# Patient Record
Sex: Female | Born: 1935 | Race: White | Hispanic: No | Smoking: Former smoker
Health system: Southern US, Community
[De-identification: ages and names within clinical notes are randomized; demographics above are authoritative.]

## PROBLEM LIST (undated history)

## (undated) DIAGNOSIS — D510 Vitamin B12 deficiency anemia due to intrinsic factor deficiency: Secondary | ICD-10-CM

## (undated) DIAGNOSIS — I10 Essential (primary) hypertension: Secondary | ICD-10-CM

## (undated) DIAGNOSIS — E039 Hypothyroidism, unspecified: Secondary | ICD-10-CM

## (undated) DIAGNOSIS — R519 Headache, unspecified: Secondary | ICD-10-CM

## (undated) DIAGNOSIS — Z66 Do not resuscitate: Secondary | ICD-10-CM

## (undated) DIAGNOSIS — M8580 Other specified disorders of bone density and structure, unspecified site: Secondary | ICD-10-CM

## (undated) DIAGNOSIS — E785 Hyperlipidemia, unspecified: Secondary | ICD-10-CM

## (undated) DIAGNOSIS — Z87891 Personal history of nicotine dependence: Secondary | ICD-10-CM

## (undated) DIAGNOSIS — D649 Anemia, unspecified: Secondary | ICD-10-CM

## (undated) DIAGNOSIS — Q2730 Arteriovenous malformation, site unspecified: Secondary | ICD-10-CM

## (undated) DIAGNOSIS — Z87898 Personal history of other specified conditions: Secondary | ICD-10-CM

## (undated) DIAGNOSIS — E669 Obesity, unspecified: Secondary | ICD-10-CM

## (undated) DIAGNOSIS — M1990 Unspecified osteoarthritis, unspecified site: Secondary | ICD-10-CM

## (undated) DIAGNOSIS — R269 Unspecified abnormalities of gait and mobility: Secondary | ICD-10-CM

## (undated) DIAGNOSIS — R2689 Other abnormalities of gait and mobility: Secondary | ICD-10-CM

## (undated) DIAGNOSIS — I639 Cerebral infarction, unspecified: Secondary | ICD-10-CM

## (undated) HISTORY — PX: HYSTERECTOMY ABDOMINAL WITH SALPINGO-OOPHORECTOMY: SHX6792

## (undated) HISTORY — DX: Vitamin B12 deficiency anemia due to intrinsic factor deficiency: D51.0

## (undated) HISTORY — DX: Hypothyroidism, unspecified: E03.9

## (undated) HISTORY — DX: Do not resuscitate: Z66

## (undated) HISTORY — PX: APPENDECTOMY: SHX54

## (undated) HISTORY — DX: Arteriovenous malformation, site unspecified: Q27.30

## (undated) HISTORY — DX: Personal history of nicotine dependence: Z87.891

## (undated) HISTORY — DX: Hyperlipidemia, unspecified: E78.5

## (undated) HISTORY — PX: CERVICAL SPINE SURGERY: SHX589

## (undated) HISTORY — DX: Obesity, unspecified: E66.9

## (undated) HISTORY — DX: Other abnormalities of gait and mobility: R26.89

## (undated) HISTORY — DX: Anemia, unspecified: D64.9

## (undated) HISTORY — DX: Cerebral infarction, unspecified: I63.9

## (undated) HISTORY — PX: REPLACEMENT TOTAL KNEE: SUR1224

## (undated) HISTORY — PX: CATARACT EXTRACTION: SUR2

## (undated) HISTORY — DX: Other specified disorders of bone density and structure, unspecified site: M85.80

## (undated) HISTORY — PX: TONSILLECTOMY: SUR1361

## (undated) HISTORY — DX: Personal history of other specified conditions: Z87.898

## (undated) HISTORY — DX: Unspecified abnormalities of gait and mobility: R26.9

## (undated) HISTORY — PX: CHOLECYSTECTOMY: SHX55

## (undated) HISTORY — DX: Essential (primary) hypertension: I10

## (undated) HISTORY — PX: TOTAL HIP ARTHROPLASTY: SHX124

---

## 2000-09-20 ENCOUNTER — Encounter: Admission: RE | Admit: 2000-09-20 | Discharge: 2000-09-20 | Payer: Self-pay | Admitting: Neurological Surgery

## 2000-09-20 ENCOUNTER — Encounter: Payer: Self-pay | Admitting: Neurological Surgery

## 2001-01-09 ENCOUNTER — Encounter: Payer: Self-pay | Admitting: Neurological Surgery

## 2001-01-09 ENCOUNTER — Observation Stay (HOSPITAL_COMMUNITY): Admission: RE | Admit: 2001-01-09 | Discharge: 2001-01-10 | Payer: Self-pay | Admitting: Neurological Surgery

## 2001-02-02 ENCOUNTER — Encounter: Admission: RE | Admit: 2001-02-02 | Discharge: 2001-02-02 | Payer: Self-pay | Admitting: Neurological Surgery

## 2001-02-02 ENCOUNTER — Encounter: Payer: Self-pay | Admitting: Neurological Surgery

## 2001-04-04 ENCOUNTER — Encounter: Payer: Self-pay | Admitting: Neurological Surgery

## 2001-04-04 ENCOUNTER — Encounter: Admission: RE | Admit: 2001-04-04 | Discharge: 2001-04-04 | Payer: Self-pay | Admitting: Neurological Surgery

## 2003-01-02 ENCOUNTER — Encounter: Admission: RE | Admit: 2003-01-02 | Discharge: 2003-01-02 | Payer: Self-pay | Admitting: Neurological Surgery

## 2003-01-02 ENCOUNTER — Encounter: Payer: Self-pay | Admitting: Neurological Surgery

## 2003-04-24 ENCOUNTER — Encounter: Admission: RE | Admit: 2003-04-24 | Discharge: 2003-04-24 | Payer: Self-pay | Admitting: Orthopedic Surgery

## 2003-04-24 ENCOUNTER — Encounter: Payer: Self-pay | Admitting: Orthopedic Surgery

## 2003-10-31 ENCOUNTER — Encounter: Admission: RE | Admit: 2003-10-31 | Discharge: 2003-10-31 | Payer: Self-pay | Admitting: Orthopedic Surgery

## 2003-12-31 ENCOUNTER — Inpatient Hospital Stay (HOSPITAL_COMMUNITY): Admission: RE | Admit: 2003-12-31 | Discharge: 2004-01-05 | Payer: Self-pay | Admitting: Orthopedic Surgery

## 2004-03-22 ENCOUNTER — Encounter (HOSPITAL_COMMUNITY): Admission: RE | Admit: 2004-03-22 | Discharge: 2004-06-20 | Payer: Self-pay | Admitting: Orthopedic Surgery

## 2004-04-16 ENCOUNTER — Encounter (INDEPENDENT_AMBULATORY_CARE_PROVIDER_SITE_OTHER): Payer: Self-pay | Admitting: *Deleted

## 2004-04-16 ENCOUNTER — Inpatient Hospital Stay (HOSPITAL_COMMUNITY): Admission: RE | Admit: 2004-04-16 | Discharge: 2004-04-19 | Payer: Self-pay | Admitting: Orthopedic Surgery

## 2006-08-23 ENCOUNTER — Encounter: Admission: RE | Admit: 2006-08-23 | Discharge: 2006-08-23 | Payer: Self-pay | Admitting: Orthopedic Surgery

## 2007-06-30 ENCOUNTER — Encounter: Admission: RE | Admit: 2007-06-30 | Discharge: 2007-06-30 | Payer: Self-pay | Admitting: Orthopedic Surgery

## 2007-07-27 ENCOUNTER — Encounter: Admission: RE | Admit: 2007-07-27 | Discharge: 2007-07-27 | Payer: Self-pay | Admitting: Neurological Surgery

## 2010-11-14 HISTORY — PX: SHOULDER ARTHROSCOPY: SHX128

## 2010-12-06 ENCOUNTER — Encounter: Payer: Self-pay | Admitting: Neurological Surgery

## 2011-04-01 NOTE — Discharge Summary (Signed)
Alexis Stevenson, Alexis Stevenson                            ACCOUNT NO.:  [id number]   MEDICAL RECORD NO.:  [id number]                   PATIENT TYPE:  INP   LOCATION:  5023                                 FACILITY:  [facility]   PHYSICIAN:  Mila Homer. Sherlean Foot, M.D.              DATE OF BIRTH:  03/06/1934   DATE OF ADMISSION:  04/16/2004  DATE OF DISCHARGE:  04/19/2004                                 DISCHARGE SUMMARY   ADMISSION DIAGNOSES:  1. Chronically painful, swollen, hot, right total knee arthroplasty with     total knee arthroplasty being 3 months old with elevated C-reactive     protein and sedimentation rate suspicious for infection.  2. Hypertension.  3. Hypercholesterolemia.   DISCHARGE DIAGNOSES:  1. Revision of right total knee arthroplasty with exchange, deep irrigation     and synovectomy.  2. Hypertension.   HISTORY OF PRESENT ILLNESS:  The patient is a 77 year old female who had a  left total knee arthroplasty by Dr. Madelon Lips 3 months previous.  The patient  had been having chronic pain, redness and swelling within the knee.  It was  evaluated with lab tests with an elevated CRP, sedimentation rate and is  suspicious for infection.  The patient was admitted for intraoperative  evaluation of the knee with possible revision and debridement with possible  expectations of removal of total knee hardware and implanted antibiotic  impregnated cement spacer with IV antibiotics if found to be infected.   ALLERGIES:  PENICILLIN.  CODEINE.  RELAFEN.  CELEBREX.  VIOXX.   CURRENT MEDICATIONS:  Glipizide, Premarin, Toprol XL, Zocor, Calcium,  Vitamin B12 and B6, Senokot, OxyContin, oxycodone.   PROCEDURE:  On April 16, 2004, the patient was taken to the operating room by  Dr. Mila Homer. Lucey, assisted by Richardean Canal, P.A.-C.  Under general  anesthesia, the patient underwent a right total knee revision, one-sided,  with polyethylene exchange, deep irrigation and debridement with  synovectomy.   Cultures were sent and no obvious signs of infection noted.  The wound was closed with PDS sutures.  One Hemovac drain was left in place.  The patient was discharged to the recovery room and then to the orthopedic  floor in good condition.   CONSULTATIONS:  1. Physical therapy.  2. Occupational therapy.  3. Case management.   HOSPITAL COURSE:  On April 16, 2004, the patient was taken to the OR by Dr.  Georgena Spurling.  A right total knee arthroplasty debridement, polyethylene  exchange and synovectomy was performed.  Cultures were sent to the  pathologist.  No obvious signs of infection were noted.  The patient was  then transferred to the recovery room and then to the orthopedic floor in  good condition for routine total knee protocol.   The patient then incurred a total of 3 days postoperative care on the  orthopedic floor in which the patient had no  complaints and no problems.  The patient worked well with physical therapy.  She was in the CPM on a  daily basis.  Her vital signs remained stable.  She remained afebrile.  Her  wound remained benign for any further signs of infection.  Her leg remained  neurovascularly intact and on postop day #3, the patient was very  comfortable and ready for discharge home.  From a medical and orthopedic  standpoint, she was both stable and ready for discharge for outpatient home  health physical therapy for routine protocol.  The patient was placed on  Lovenox for routine DVT prophylaxis and she was discharged home for routine  protocol.   LABORATORY DATA AND X-RAY FINDINGS:  H&H on June 6, with hemoglobin 10.4,  hematocrit 30.4.  Routine chemistries on June 5, with sodium 141, potassium  3.9, glucose 96, BUN 2, creatinine 0.7.  Synovial cell count  intraoperatively was red and cloudy.  WBCs were not counted.  Neutrophils  were too few to count.  Lymphocytes were rare, no crystals.  Routine  urinalysis on admission was normal.  Gram stains with no  growth x3 days.  Tissue cultures were no growth x2 days.   DISCHARGE MEDICATIONS:  1. Percocet one to two tablets every 4-6 hours p.r.n. pain.  2. Arixtra 2.5 mg subcu q.24h.  3. Capoten 25 mg p.o. q.d.  4. Hydrochlorothiazide 25 mg p.o. q.d.  5. Premarin 0.625 mg p.o. q.d.  6. Toprol XL 50 mg p.o. q.d.  7. Zocor 40 mg p.o. q.d.  8. Laxative or enema of choice p.r.n.  9. Ocuvite one tablet p.o. q.d.  10.      Vitamin B6 100 mg p.o. q.d.  11.      Calcium carbonate 500 mg one tablet p.o. q.d.  12.      Colace 100 mg p.o. b.i.d.  13.      Trinsicon one tablet p.o. t.i.d.  14.      Skelaxin one to two tablets every 4-6 hours p.r.n.  15.      Restoril 15 mg p.o. q.6h. p.r.n.  16.      Potassium chloride 40 mEq p.o. b.i.d.   SPECIAL INSTRUCTIONS:  The patient may resume preoperative medications  except for OxyContin.  Percocet one to two tablets every 4-6h. for pain.  Arixtra 2.5 mg injection once a day until June 10, then may take one baby  aspirin a day for 1 month.  Keflex 500 mg one tablet four times a day until  gone.   DIET:  No restrictions.   ACTIVITY:  Per total knee discharge sheet.   WOUND CARE:  Per total knee discharge sheet.   FOLLOW UP:  The patient is to call Dr. Candise Bowens office for a follow-up  appointment in the next 10-12 days, at [phone], for an appointment.   CONDITION ON DISCHARGE:  Improved.     Jamelle Rushing, P.A.                      Mila Homer. Sherlean Foot, M.D.   RWK/MEDQ  D:  06/07/2004  T:  06/07/2004  Job:  [id number]

## 2011-04-01 NOTE — Op Note (Signed)
[place]  Patient:    Alexis Stevenson, Alexis Stevenson                         MRN: [id number] Proc. Date: 01/09/01 Adm. Date:  [id number] Attending:  Jonne Ply                           Operative Report  PREOPERATIVE DIAGNOSES: 1. Cervical spondylosis, C3-4, with cervical radiculopathy. 2. Status post arthrodesis, C4 to C7, in 1999.  POSTOPERATIVE DIAGNOSIS: 1. Cervical spondylosis, C3-4, with cervical radiculopathy. 2. Status post arthrodesis, C4 to C7, in 1999.  PROCEDURES:  Anterior cervical diskectomy, C3-4, arthrodesis with structural allograft and Synthes fixation, C3, C4, and removal of anterior plate from C4 to C7.  SURGEON:  Stefani Dama, M.D.  FIRST ASSISTANT:  Hewitt Shorts, M.D.  ANESTHESIA:  General endotracheal.  INDICATIONS:  The patient is a 77 year old individual who has had significant neck, shoulder, and arm pain.  She has had a previous anterior diskectomy and arthrodesis from C4 to C7 in 1999.  She had good relief of her symptoms at that time but over the past four to five months time has developed progressive radiculopathy with neck pain, pain in the shoulders, and headaches.  Follow-up MRI demonstrated that some mild spondylitic changes at C3-4 had progressed significantly.  She now has evidence of cord compression and biforaminal stenosis at the C3-4 level.  She was advised regarding surgery.  DESCRIPTION OF PROCEDURE:  The patient was brought to the operating room and placed on the table in supine position.  After the smooth induction of general endotracheal anesthesia, she was placed in five pounds of Holter traction, and the neck was shaved, prepped with Duraprep, and draped in a sterile fashion. An elliptical incision was made around the previous scar, and this was excised.  Dissection was carried down through the platysma, and the plane between the sternocleidomastoid and the strap muscles was dissected  bluntly until the prevertebral space was reached and identification of the previous hardware was made at about the level of C5.  The hardware was then dissected cephalad and inferiorly to expose the screw holes from C4 to C7.  These were then removed, and the plate was removed.  The underlying area was checked for hemostasis, and dissection was then carried out cephalad to expose the C3-4 disk space.  At this point, a self-retaining Caspar retractor was placed into the wound under longus colli muscle.  The disk space at C3-4 was noted to be significantly sclerotic.  A large ventral osteophyte was removed from this area using a rongeur.  Then the disk space was entered and significant quantity of markedly degenerated material that was quite desiccated was removed.  The self-retaining disk spreader was placed into the wound, and then the posterior aspect of the disk space was explored.  There was noted to be significant osteophyte at the inferior margin of the body of C3.  This was drilled down with a Anspach drill and a 2.3 mm matchstick bur.  The dissection was also carried out to the lateral recesses, where a rather large uncinate spur was encountered on the superior portion of the body of C4 on both sides. The right side was taken down first.  The left side was then decompressed, and the foramen was well-decompressed after removal of the spur.  Epidural bleeding was encountered from the lateral aspects  of the dissection.  This was tamponaded with some Gelfoam soaked in thrombin, which was later removed and irrigated away.  In the end, the interspace was noted to be free and clear of any debris.  All the edges were decorticated, and a patellar wedge graft was cut to the appropriate size at an 8 mm height and placed into the interspace. Distraction was removed, and the neck was placed in slight flexion.  A 20 mm small-stature Synthes plate was then affixed from C3 to C4 using 4 x 14  mm locking screws.  Radiographic confirmation of the position of the graft and the plate was obtained, and then hemostasis in the soft tissues was obtained. However, due to the length of the dissection, a small Jackson-Pratt drain was left in place for evacuation of any fluid that may result in an exudate.  The wound was then closed with 3-0 Vicryl in the platysma and 3-0 Vicryl in the subcuticular tissues.  A dry sterile dressing was placed on the neck.  Blood loss was estimated at 150 cc.  The patient tolerated the procedure well and returned to the recovery room in stable condition. DD:  01/09/01 TD:  01/09/01 Job: [id number]

## 2011-04-01 NOTE — H&P (Signed)
NAME:  Alexis Stevenson, Alexis Stevenson                            ACCOUNT NO.:  [id number]   MEDICAL RECORD NO.:  [id number]                   PATIENT TYPE:  INP   LOCATION:  NA                                   FACILITY:  [facility]   PHYSICIAN:  Dyke Brackett, M.D.                 DATE OF BIRTH:  01/13/1934   DATE OF ADMISSION:  12/31/2003  DATE OF DISCHARGE:                                HISTORY & PHYSICAL   CHIEF COMPLAINT:  Right knee pain.   HISTORY OF PRESENT ILLNESS:  Alexis Stevenson is a 77 year old white female with  several years of right knee problems.  She did have some popping, clicking,  locking which required an arthroscopy in 2001.  She had a significant  meniscal injury with debridement.  She did have some short term improvement  of her symptoms but had rapid return of problems and worsening of pain.  The  patient currently has severe pain in her knee with any type of weight-  bearing activity.  She describes this as a sharp, stabbing pain.  It is  improved somewhat when she is off her feet.  The pain does occasionally  radiate down the leg.  She does have swelling at times. She does have  significant popping and grinding for several years now.  She is currently  requiring a cane to assist ambulation.   MRI findings within the knee show a large femoral chondral cartilage defect  of the medial femoral condyle approximately 13 mm in diameter and a large  lateral femoral condyle flap of articular cartilage, possibly being on the  tibial plateau.  She does also have some general chondromalacia changes  throughout.   ALLERGIES:  PENICILLIN and CODEINE.  The patient does have increased  hypertensive issues with ANTI-INFLAMMATORIES, (examples Vioxx, Celebrex,  Relafen).   CURRENT MEDICATIONS:  1. Capozide 25/25 mg p.o. daily.  2. Premarin 0.625 mg p.o. daily.  3. Toprol XL 50 mg p.o. daily.  4. Zocor 40 mg p.o. daily.  5. ICAPS one tablet p.o. daily.  6. Vitamin B6, 100 mcg p.o. daily.  7.  Vitamin B12 1000 mcg p.o. daily.  8. Calcium and vitamin D, 900 mg/300 international units q.d.  9. Senokot 2 tablets q.h.s.  10.      Iron 325 mg two tablets daily.  11.      Tylenol PRN.   PAST MEDICAL HISTORY:  1. Hypertension.  2. Peptic ulcer disease.  3. Hypercholesterolemia.  4. Poor vision.   PAST SURGICAL HISTORY:  Appendectomy in 1955, Cesarean section in 1961,  tonsillectomy in 1964, hysterectomy in 1969, hammer toe repair in 1993,  cervical spinal fusion in 1999, second spinal fusion in 2001, knee scope in  2001.  Patient denies any complications to the above-mentioned surgical  procedures.   SOCIAL HISTORY:  The patient is a healthy-appearing 77 year old white  female.  She does  smoke several cigarettes a day for the last 35 years.  She  denies alcohol.  She is currently married, lives with her husband in a split  level house, six steps to the main entrance.  She is currently retired.  She  has several grown children.   PRIMARY CARE PHYSICIAN:  Her family physician is Dr. Gwendlyn Deutscher, II, [phone].  Her gastroenterologist is Dr. Jeanie Sewer.   FAMILY MEDICAL HISTORY:  Mother is deceased from a stroke.  Father deceased  from heart disease.  The patient has one brother and one sister deceased  from a drowning accident.  One brother deceased from lung cancer.  Three  brothers alive, one with a history of CABG and hypertension, one with colon  cancer and one with hypertension.  One sister alive with unknown medical  issues.   REVIEW OF SYMPTOMS:  Positive for glasses.  She does have occasional  constipation but it is fairly well controlled with the Senokot and she  otherwise has no other review of systems complaints.   PHYSICAL EXAMINATION:  VITAL SIGNS:  Height 5 feet 6 inches, weight 160  pounds.  Blood pressure 138/68.  Pulse 88 and regular.  Respirations 12.  The patient is afebrile.  GENERAL:  This is a healthy-appearing, well-developed white female.  She   does walk with a cane with right leg with significant limp.  She is able to  get on and off the examine table by herself but it does require some effort.  She does constantly have a open patella knee brace on her knee for stability  reasons.  HEENT:  Head was normocephalic.  Pupils equal, round, reactive and  accommodating to light.  Extraocular movements intact.  Sclerae is not  icteric. Conjunctiva is pink and moist.  External ears were without  deformity.  Hearing is intact grossly.  Nasal septum is midline.  Oral  buccal mucosa is pink, moist without lesions.  Dentition is in good repair.  She appeared to have several crowns in place.  NECK:  Supple with no palpable lymphadenopathy.  She is nontender in the  thyroid region.  Due to her spinal fusion she has decreased range of motion  of her cervical spine.  She is able to touch her chin to within two  fingerbreadths of the neck.  She is able to elevate her neck only to about  45 degrees, to rotate to the left 45 degrees and to the right about 60  degrees.  She was slightly sore with percussion along the entire spinal  column.  CHEST:  Lung sounds clear and equal bilaterally.  No wheezes, rales,  rhonchi's or rubs noted.  HEART:  Regular rate and rhythm, S1 and S2 auscultated.  No murmurs, rubs or  gallops noted.  ABDOMEN:  Slightly hyperactive bowel sounds heard.  She is nontender with  palpation throughout.  There is no hepatosplenomegaly.  EXTREMITIES:  Upper extremities are symmetrically sized and shape.  She has  full range of motion of her shoulders, elbows and wrists without any  difficulty.  Motor strength is 5/5.   Lower extremities right and left hip were slightly sore with range of motion  but she had full extension and flexion up to 130 degrees, right was more  sore with internal/external rotation.  Left was asymptomatic.  Right knee was round, boggy appearing.  She had no palpable effusion at this time.  She  is tender  along the medial and lateral joint line.  She had full extension.  She had 110 degrees of flexion.  She has about a 5 to 10 degree valgus varus  laxity.  Calf was nontender.  Left knee was without any signs of erythema or  ecchymoses.  No palpable effusion.  Minimal tenderness along the joint line.  No instability.  She had full extension and flexion to 30 degrees.  Calf was  nontender.  Ankles were symmetrical with good dorsiflexion and plantar  flexion.   Peripheral vasculature carotid pulses were 2+ with no bruits.  Radial pulses  2+.  Dorsalis pedis pulse and posterior tibial pulses were 1+.  She had  brisk capillary refill in the lower extremities.  No lower extremity edema  noted.  NEUROLOGICAL:  Patient is conscious, alert and appropriate and has easy  conversation with examiner.  Cranial nerves II-XII are grossly intact.  She  has light touch sensation intact from head to toe.  She has no gross  neurological defects noted.  BREASTS/RECTAL/GENITOURINARY:  Examinations deferred at this time.   IMPRESSION:  1. End-stage osteoarthritis right knee.  2. Hypertension.  3. History of peptic ulcer disease.  4. Hypercholesterolemia.  5. Decreased vision.   PLAN:  The patient will undergo all routine labs and tests prior to having a  right total knee arthroplasty by Dr. Madelon Lips on December 31, 2003.  The  patient has donated two units of autologous blood at this time.      Jamelle Rushing, Arnetha Courser, M.D.    RWK/MEDQ  D:  12/25/2003  T:  12/25/2003  Job:  [id number]

## 2011-04-01 NOTE — Discharge Summary (Signed)
NAME:  Alexis Stevenson, Alexis Stevenson                            ACCOUNT NO.:  [id number]   MEDICAL RECORD NO.:  [id number]                   PATIENT TYPE:  INP   LOCATION:  5040                                 FACILITY:  [facility]   PHYSICIAN:  Dyke Brackett, M.D.                 DATE OF BIRTH:  28-Oct-1934   DATE OF ADMISSION:  12/31/2003  DATE OF DISCHARGE:  01/05/2004                                 DISCHARGE SUMMARY   ADMITTING DIAGNOSES:  1. End-stage osteoarthritis, right knee.  2. Hypertension.  3. History of peptic ulcer disease.  4. Hypercholesterolemia.  5. Decreased vision.   DISCHARGE DIAGNOSES:  1. Status post right total knee arthroplasty.  2. Acute blood loss anemia secondary to surgery, asymptomatic.  3. Hypokalemia, resolved.  4. Hypertension.  5. History of peptic ulcer disease.  6. Hypercholesterolemia.  7. Decreased vision.   HISTORY OF PRESENT ILLNESS:  Alexis Stevenson is a 77 year old white female with a  history of right knee pain for several years.  Mechanical symptoms include  positive clicking and locking for which she underwent a knee arthroscopy in  2001.  The patient, at that time, had significant meniscal injury and  underwent debridement.  She had some short-term improvement of her symptoms  but her pain rapidly returned and problems were worse as far as pain.  She  currently describes her right knee pain as severe pain with any  weightbearing activity.  The pain is sharp, stabbing in nature.  The pain  occasionally radiates down her leg.  She does have swelling in her legs at  times and in her right knee at times.  The patient requires the assistance  of a cane to ambulate.  MRI findings showed large femoral condyle cartilage  defect of the medial femoral condyle approximately 13 mm in diameter and a  large lateral femoral condyle flap of articular cartilage, possibly being on  the tibial plateau.  Some general chondromalacia changes were seen  throughout the knee.  The  patient, therefore, was admitted to [facility] on 12/31/2003 to undergo a right total knee replacement.   ALLERGIES:  PENICILLIN and CODEINE.  Also, increased hypersensitivity issues  with ANTI INFLAMMATORIES, i.e. VIOXX, CELEBREX, RELAFEN.   CURRENT MEDICATIONS:  1. Catazide 25/25 mg p.o. q.d.  2. Premarin 0.625 mg p.o. q.d.  3. Toprol XL 50 mg p.o. q.d.  4. Zocor 40 mg p.o. q.d.  5. ICAPS 1 tab p.o. q.d.  6. Vitamin B6 100 mcg p.o. q.d.  7. Vitamin B12 1000 mcg p.o. q.d.  8. Calcium with vitamin D 900 mg/300 international units q.d.  9. Senokot 2 tabs q.h.s.  10.      Iron 325 mg, 2 tabs q.d.  11.      Tylenol p.r.n.   SURGICAL PROCEDURE:  The patient was taken to the operating room on December 31, 2003, by  Dr. Madelon Lips assisted by Alexis Stevenson, P.A.-C.  The patient  was placed under general anesthesia with a preoperative femoral nerve block.  Right total knee replacement was performed using a LCS total knee with  standard femur, standard patella 2.5 size tibia with a 50 mm bearing.  The  patient tolerated the procedure well and returned to recovery in good and  stable condition.   CONSULTS:  The following consults were obtained when the patient was  hospitalized:  PT, OT and case management.   HOSPITAL COURSE:  The patient developed acute blood loss anemia secondary to  surgery, however, remained asymptomatic throughout hospital course and  required no blood products.  H&H on January 04, 2004, was 9.7 and 26.3.  The patient also developed hypokalemia during hospital stay, however, this  was treated and resolved.  The patient developed leukocytosis with a maximal  white blood count of 13,300.  However, the patient remained afebrile  throughout hospital stay.  Urinalysis was repeated during hospital stay and  only showed rare bacteria.  At the time of discharge, white blood count was  11,200.  The patient otherwise was discharged home in good and stable  condition  on postoperative day 5.   ROUTINE LABS ON ADMISSION:  CBC:  White blood count 10.7, hemoglobin 13.3,  hematocrit 38.3, platelets 330.  Coags on admission:  PT 12.8, INR 0.9, PTT  42.  Routine chemistries:  Sodium 140, potassium 3.6, chloride 104, bicarb  29, glucose 109, BUN 9, creatinine 0.8.  Hepatic enzymes on admission:  Negative.   Urinalysis on admission was negative.   EKG:  Dated December 25, 2003, showed bradycardia with an unusual P axis.  Heart rate was 59 beats per minute, PR interval 122 ms, PRT axis negative  43, 4 and 41.   Lower extremity Doppler performed on January 03, 2004, showed no evidence  of DVT, SVT or Morissette's cyst bilaterally.   DISCHARGE INSTRUCTIONS:  Meds, the patient to resume home meds.  The  following meds are to be added:  1. Percocet 5 mg, 1 to 2 tablets a day q.4-6h. p.r.n. pain.  2. Arixtra 2.5 mg subcu injection daily for 5 days.  3. Over-the-counter stool softener.   ACTIVITY:  Weightbearing as tolerated with walker.  Home health physical  therapy with Genevieve Norlander.   DIET:  No restrictions.   WOUND CARE:  The patient is to keep the wound clean and dry.  To check for  infection daily, and to call our office if it she has any concerns.   SPECIAL INSTRUCTIONS:  CPM 0 to 90 degrees 6 to 8 hours a day, increase by  10 degrees daily.   FOLLOWUP:  The patient is to follow up with Dr. Madelon Lips 10 days from  discharge.  The patient to call office at [phone] for followup.   CONDITION ON DISCHARGE:  The patient was discharged home in good and stable  condition.      Richardean Canal, Arnetha Courser, M.D.    GC/MEDQ  D:  01/27/2004  T:  01/30/2004  Job:  [id number]

## 2011-04-01 NOTE — Op Note (Signed)
Alexis Stevenson, Alexis Stevenson                            ACCOUNT NO.:  [id number]   MEDICAL RECORD NO.:  [id number]                   PATIENT TYPE:  INP   LOCATION:  5023                                 FACILITY:  [facility]   PHYSICIAN:  Mila Homer. Sherlean Foot, M.D.              DATE OF BIRTH:  07-Dec-1933   DATE OF PROCEDURE:  04/16/2004  DATE OF DISCHARGE:                                 OPERATIVE REPORT   SURGEON:  Mila Homer. Sherlean Foot, M.D.   ASSISTANT:  Madilyn Fireman, P.A.-C.   ANESTHESIA:  General.   PREOPERATIVE DIAGNOSIS:  Right total knee infection.   POSTOPERATIVE DIAGNOSIS:  Right total knee infection.   PROCEDURE:  Right total knee revision (one side) and deep irrigation and  debridement.   INDICATIONS FOR PROCEDURE:  The patient is a 77 year old just over three  months status post total knee arthroplasty by my partner, Dr. Madelon Lips.  Over  the past month or two, the knee has appeared infected and lab work has been  very, very suspicious.  She has failed to improve, at all.  She consented to  revision with polyethylene exchange and deep I&D with tissue cultures and  synovectomy.   DESCRIPTION OF PROCEDURE:  The patient was laid supine and administered  general anesthesia.  The right lower extremity was prepped and draped in the  usual sterile fashion.  A #10 blade was used to make the standard midline  incision through the old incision.  A fresh #10 blade was used to make a  median parapatellar arthrotomy.  A complete synovectomy was performed  throughout the knee joint.  I then elevated the deep MCL off the medial  crest of the tibia around to the semimembranous tendon.  I then flexed the  knee an amputated the keel of the polyethylene with an osteotome and removed  the polyethylene.  I continued to perform an aggressive synovectomy  throughout both medial and lateral gutters and the suprapatellar pouch,  posterior recess, as well as in the notch.  I then irrigated with 3000 mL of  normal  saline via the pulse lavage system.  I then placed a new fresh  polyethylene in the knee and located the knee.  I did break up some  adhesions to get a little further extension.  I took deep tissue cultures in  the notch and in the synovium in the patellofemoral joint prior to  performing synovectomy.  I also took fluid cultures and sent off for cell  count with differential prior to the start of the case, as well.  I then  irrigated once again and then closed the arthrotomy with interrupted figure-  of-eight #1 PDS sutures and a layer of 0 buried PDS sutures and then a  subcuticular 2-0 Vicryl and skin staples.  I left the Hemovac deep to the  arthrotomy coming out superolateral.  Tourniquet time was 35 minutes.  Complications  none.  Drains one Hemovac.  Estimated blood loss minimal.                                               Mila Homer. Sherlean Foot, M.D.    SDL/MEDQ  D:  04/16/2004  T:  04/16/2004  Job:  [id number]

## 2011-04-01 NOTE — Op Note (Signed)
NAME:  [name], [name]                            ACCOUNT NO.:  [id number]   MEDICAL RECORD NO.:  [id number]                   PATIENT TYPE:  INP   LOCATION:  2550                                 FACILITY:  [facility]   PHYSICIAN:  Thera Flake., M.D.             DATE OF BIRTH:  1934-04-28   DATE OF PROCEDURE:  12/31/2003  DATE OF DISCHARGE:                                 OPERATIVE REPORT   PREOPERATIVE DIAGNOSIS:  Osteoarthritis right knee.   POSTOPERATIVE DIAGNOSIS:  Osteoarthritis right knee.   OPERATION:  Right total knee replacement (cemented LCS to total knee with  standard femur, standard patella 2.5 size tibia with 50 mm bearing).   SURGEON:  Dyke Brackett, M.D.   ASSISTANTSol Passer.   TOURNIQUET TIME:  1 hour, 20 minutes.   DESCRIPTION OF PROCEDURE:  Sterile prep and drape, exsanguination of the  leg.  Placement of tourniquet to 375.  Straight skin incision was made with  a medial parapatellar approach to the knee made.  The lateral compartment  and medial compartment were both approximately equally worn with  patellofemoral arthrosis, grade 4 changes of all four compartments noted.  Tibia was cut with a 10 degree posterior slope followed by sizing of the  femur.  Anterior and posterior femoral cuts made with the sizing block being  on the flexion gap equal to 15 mm, made equal to the extension at 15 mm.  Distal femoral cut was made followed by debridement of the soft tissues.  Ligaments were relatively well balanced but did require a moderate amount of  medial stripping to balance the ligaments equally with a 15 mm spatial  block.  Anterior posterior Chamfers were made with the two  holes for the  prosthesis followed by exposure of the tibia with release of the PCL.  The  2.5 size tibia was used with a keel.  The depth was enlarged approximately 2  mm to increase the cement mantle __________ bone.  Trial reduction was  carried out, patella was cut initially, leaving 14 mm.   This basically  overstuffed the joint, an additional 2 mm was resected.  Excellent stability  was obtained with the trial.  No tendon super bearing subluxation, full  flexion/extension and good tracking at the patella.  The trial components  were removed followed by irrigation of the bony surfaces.  Final components  were inserted, tibia followed by femur and patella.  Antibiotic-impregnated  cement, two packs with each, 750 mg of cefurox, was used.  Cement was  allowed to harden, excess cement was removed.  Again, stability, motion was  deemed to be excellent.  The tourniquet was released, small bleeders were  coagulated.  Hemovac drain was placed superolaterally, placed in the lateral  gutter.  Closure was effected with #2 Ethibond, 2-0 Vicryl, skin clips  applied, a compressive sterile dressing and knee immobilizer applied.  Taken  to recovery in stable condition.                                               Thera Flake., M.D.    WDC/MEDQ  D:  12/31/2003  T:  12/31/2003  Job:  [id number]

## 2011-11-16 DIAGNOSIS — M6281 Muscle weakness (generalized): Secondary | ICD-10-CM | POA: Diagnosis not present

## 2011-11-16 DIAGNOSIS — M25519 Pain in unspecified shoulder: Secondary | ICD-10-CM | POA: Diagnosis not present

## 2011-11-16 DIAGNOSIS — M75120 Complete rotator cuff tear or rupture of unspecified shoulder, not specified as traumatic: Secondary | ICD-10-CM | POA: Diagnosis not present

## 2011-11-18 DIAGNOSIS — M25519 Pain in unspecified shoulder: Secondary | ICD-10-CM | POA: Diagnosis not present

## 2011-11-18 DIAGNOSIS — M75120 Complete rotator cuff tear or rupture of unspecified shoulder, not specified as traumatic: Secondary | ICD-10-CM | POA: Diagnosis not present

## 2011-11-18 DIAGNOSIS — M6281 Muscle weakness (generalized): Secondary | ICD-10-CM | POA: Diagnosis not present

## 2011-11-22 DIAGNOSIS — M75120 Complete rotator cuff tear or rupture of unspecified shoulder, not specified as traumatic: Secondary | ICD-10-CM | POA: Diagnosis not present

## 2011-11-22 DIAGNOSIS — M6281 Muscle weakness (generalized): Secondary | ICD-10-CM | POA: Diagnosis not present

## 2011-11-22 DIAGNOSIS — M25519 Pain in unspecified shoulder: Secondary | ICD-10-CM | POA: Diagnosis not present

## 2011-11-23 DIAGNOSIS — M25519 Pain in unspecified shoulder: Secondary | ICD-10-CM | POA: Diagnosis not present

## 2011-11-23 DIAGNOSIS — M75120 Complete rotator cuff tear or rupture of unspecified shoulder, not specified as traumatic: Secondary | ICD-10-CM | POA: Diagnosis not present

## 2011-11-23 DIAGNOSIS — M6281 Muscle weakness (generalized): Secondary | ICD-10-CM | POA: Diagnosis not present

## 2011-11-25 DIAGNOSIS — M75120 Complete rotator cuff tear or rupture of unspecified shoulder, not specified as traumatic: Secondary | ICD-10-CM | POA: Diagnosis not present

## 2011-11-25 DIAGNOSIS — M6281 Muscle weakness (generalized): Secondary | ICD-10-CM | POA: Diagnosis not present

## 2011-11-25 DIAGNOSIS — M25519 Pain in unspecified shoulder: Secondary | ICD-10-CM | POA: Diagnosis not present

## 2011-11-25 DIAGNOSIS — M1710 Unilateral primary osteoarthritis, unspecified knee: Secondary | ICD-10-CM | POA: Diagnosis not present

## 2011-11-28 DIAGNOSIS — M75120 Complete rotator cuff tear or rupture of unspecified shoulder, not specified as traumatic: Secondary | ICD-10-CM | POA: Diagnosis not present

## 2011-11-28 DIAGNOSIS — M25519 Pain in unspecified shoulder: Secondary | ICD-10-CM | POA: Diagnosis not present

## 2011-11-28 DIAGNOSIS — M6281 Muscle weakness (generalized): Secondary | ICD-10-CM | POA: Diagnosis not present

## 2011-11-30 DIAGNOSIS — M25519 Pain in unspecified shoulder: Secondary | ICD-10-CM | POA: Diagnosis not present

## 2011-11-30 DIAGNOSIS — M75120 Complete rotator cuff tear or rupture of unspecified shoulder, not specified as traumatic: Secondary | ICD-10-CM | POA: Diagnosis not present

## 2011-11-30 DIAGNOSIS — M6281 Muscle weakness (generalized): Secondary | ICD-10-CM | POA: Diagnosis not present

## 2011-12-02 DIAGNOSIS — M1710 Unilateral primary osteoarthritis, unspecified knee: Secondary | ICD-10-CM | POA: Diagnosis not present

## 2011-12-05 DIAGNOSIS — M75120 Complete rotator cuff tear or rupture of unspecified shoulder, not specified as traumatic: Secondary | ICD-10-CM | POA: Diagnosis not present

## 2011-12-05 DIAGNOSIS — M6281 Muscle weakness (generalized): Secondary | ICD-10-CM | POA: Diagnosis not present

## 2011-12-05 DIAGNOSIS — M25519 Pain in unspecified shoulder: Secondary | ICD-10-CM | POA: Diagnosis not present

## 2011-12-06 DIAGNOSIS — I7300 Raynaud's syndrome without gangrene: Secondary | ICD-10-CM | POA: Diagnosis not present

## 2011-12-06 DIAGNOSIS — M19019 Primary osteoarthritis, unspecified shoulder: Secondary | ICD-10-CM | POA: Diagnosis not present

## 2011-12-06 DIAGNOSIS — M1710 Unilateral primary osteoarthritis, unspecified knee: Secondary | ICD-10-CM | POA: Diagnosis not present

## 2011-12-07 DIAGNOSIS — M25519 Pain in unspecified shoulder: Secondary | ICD-10-CM | POA: Diagnosis not present

## 2011-12-07 DIAGNOSIS — M6281 Muscle weakness (generalized): Secondary | ICD-10-CM | POA: Diagnosis not present

## 2011-12-07 DIAGNOSIS — M75120 Complete rotator cuff tear or rupture of unspecified shoulder, not specified as traumatic: Secondary | ICD-10-CM | POA: Diagnosis not present

## 2011-12-12 DIAGNOSIS — M6281 Muscle weakness (generalized): Secondary | ICD-10-CM | POA: Diagnosis not present

## 2011-12-12 DIAGNOSIS — M75120 Complete rotator cuff tear or rupture of unspecified shoulder, not specified as traumatic: Secondary | ICD-10-CM | POA: Diagnosis not present

## 2011-12-12 DIAGNOSIS — M25519 Pain in unspecified shoulder: Secondary | ICD-10-CM | POA: Diagnosis not present

## 2011-12-14 DIAGNOSIS — M6281 Muscle weakness (generalized): Secondary | ICD-10-CM | POA: Diagnosis not present

## 2011-12-14 DIAGNOSIS — M75120 Complete rotator cuff tear or rupture of unspecified shoulder, not specified as traumatic: Secondary | ICD-10-CM | POA: Diagnosis not present

## 2011-12-14 DIAGNOSIS — M25519 Pain in unspecified shoulder: Secondary | ICD-10-CM | POA: Diagnosis not present

## 2011-12-16 DIAGNOSIS — M1710 Unilateral primary osteoarthritis, unspecified knee: Secondary | ICD-10-CM | POA: Diagnosis not present

## 2011-12-16 DIAGNOSIS — M75120 Complete rotator cuff tear or rupture of unspecified shoulder, not specified as traumatic: Secondary | ICD-10-CM | POA: Diagnosis not present

## 2011-12-16 DIAGNOSIS — M25519 Pain in unspecified shoulder: Secondary | ICD-10-CM | POA: Diagnosis not present

## 2011-12-16 DIAGNOSIS — M6281 Muscle weakness (generalized): Secondary | ICD-10-CM | POA: Diagnosis not present

## 2012-01-25 DIAGNOSIS — R0989 Other specified symptoms and signs involving the circulatory and respiratory systems: Secondary | ICD-10-CM | POA: Diagnosis not present

## 2012-01-25 DIAGNOSIS — R0609 Other forms of dyspnea: Secondary | ICD-10-CM | POA: Diagnosis not present

## 2012-02-14 DIAGNOSIS — D1330 Benign neoplasm of unspecified part of small intestine: Secondary | ICD-10-CM | POA: Diagnosis not present

## 2012-02-14 DIAGNOSIS — R0989 Other specified symptoms and signs involving the circulatory and respiratory systems: Secondary | ICD-10-CM | POA: Diagnosis not present

## 2012-02-14 DIAGNOSIS — K5909 Other constipation: Secondary | ICD-10-CM | POA: Diagnosis present

## 2012-02-14 DIAGNOSIS — Z8719 Personal history of other diseases of the digestive system: Secondary | ICD-10-CM | POA: Diagnosis not present

## 2012-02-14 DIAGNOSIS — Z79899 Other long term (current) drug therapy: Secondary | ICD-10-CM | POA: Diagnosis not present

## 2012-02-14 DIAGNOSIS — E7800 Pure hypercholesterolemia, unspecified: Secondary | ICD-10-CM | POA: Diagnosis not present

## 2012-02-14 DIAGNOSIS — D62 Acute posthemorrhagic anemia: Secondary | ICD-10-CM | POA: Diagnosis not present

## 2012-02-14 DIAGNOSIS — D500 Iron deficiency anemia secondary to blood loss (chronic): Secondary | ICD-10-CM | POA: Diagnosis not present

## 2012-02-14 DIAGNOSIS — I10 Essential (primary) hypertension: Secondary | ICD-10-CM | POA: Diagnosis not present

## 2012-02-14 DIAGNOSIS — K921 Melena: Secondary | ICD-10-CM | POA: Diagnosis not present

## 2012-02-14 DIAGNOSIS — R404 Transient alteration of awareness: Secondary | ICD-10-CM | POA: Diagnosis not present

## 2012-02-14 DIAGNOSIS — K5730 Diverticulosis of large intestine without perforation or abscess without bleeding: Secondary | ICD-10-CM | POA: Diagnosis not present

## 2012-02-14 DIAGNOSIS — Z7982 Long term (current) use of aspirin: Secondary | ICD-10-CM | POA: Diagnosis not present

## 2012-02-14 DIAGNOSIS — R42 Dizziness and giddiness: Secondary | ICD-10-CM | POA: Diagnosis not present

## 2012-02-14 DIAGNOSIS — D126 Benign neoplasm of colon, unspecified: Secondary | ICD-10-CM | POA: Diagnosis not present

## 2012-02-14 DIAGNOSIS — K449 Diaphragmatic hernia without obstruction or gangrene: Secondary | ICD-10-CM | POA: Diagnosis not present

## 2012-02-14 DIAGNOSIS — Z8249 Family history of ischemic heart disease and other diseases of the circulatory system: Secondary | ICD-10-CM | POA: Diagnosis not present

## 2012-02-14 DIAGNOSIS — J449 Chronic obstructive pulmonary disease, unspecified: Secondary | ICD-10-CM | POA: Diagnosis not present

## 2012-02-14 DIAGNOSIS — K922 Gastrointestinal hemorrhage, unspecified: Secondary | ICD-10-CM | POA: Diagnosis not present

## 2012-02-14 DIAGNOSIS — J4489 Other specified chronic obstructive pulmonary disease: Secondary | ICD-10-CM | POA: Diagnosis not present

## 2012-02-14 DIAGNOSIS — G8929 Other chronic pain: Secondary | ICD-10-CM | POA: Diagnosis present

## 2012-02-14 DIAGNOSIS — Z981 Arthrodesis status: Secondary | ICD-10-CM | POA: Diagnosis not present

## 2012-02-14 DIAGNOSIS — D649 Anemia, unspecified: Secondary | ICD-10-CM | POA: Diagnosis not present

## 2012-02-14 DIAGNOSIS — M545 Low back pain: Secondary | ICD-10-CM | POA: Diagnosis present

## 2012-02-14 DIAGNOSIS — K6389 Other specified diseases of intestine: Secondary | ICD-10-CM | POA: Diagnosis present

## 2012-02-14 DIAGNOSIS — K219 Gastro-esophageal reflux disease without esophagitis: Secondary | ICD-10-CM | POA: Diagnosis not present

## 2012-02-14 DIAGNOSIS — F411 Generalized anxiety disorder: Secondary | ICD-10-CM | POA: Diagnosis present

## 2012-02-14 DIAGNOSIS — E039 Hypothyroidism, unspecified: Secondary | ICD-10-CM | POA: Diagnosis not present

## 2012-02-14 DIAGNOSIS — F329 Major depressive disorder, single episode, unspecified: Secondary | ICD-10-CM | POA: Diagnosis present

## 2012-02-14 DIAGNOSIS — R55 Syncope and collapse: Secondary | ICD-10-CM | POA: Diagnosis not present

## 2012-02-14 DIAGNOSIS — R0602 Shortness of breath: Secondary | ICD-10-CM | POA: Diagnosis present

## 2012-02-14 DIAGNOSIS — R0609 Other forms of dyspnea: Secondary | ICD-10-CM | POA: Diagnosis not present

## 2012-02-14 DIAGNOSIS — Z86010 Personal history of colonic polyps: Secondary | ICD-10-CM | POA: Diagnosis not present

## 2012-02-14 DIAGNOSIS — K8020 Calculus of gallbladder without cholecystitis without obstruction: Secondary | ICD-10-CM | POA: Diagnosis not present

## 2012-02-14 DIAGNOSIS — A048 Other specified bacterial intestinal infections: Secondary | ICD-10-CM | POA: Diagnosis not present

## 2012-02-22 DIAGNOSIS — I10 Essential (primary) hypertension: Secondary | ICD-10-CM | POA: Diagnosis not present

## 2012-02-22 DIAGNOSIS — K921 Melena: Secondary | ICD-10-CM | POA: Diagnosis not present

## 2012-03-20 DIAGNOSIS — D500 Iron deficiency anemia secondary to blood loss (chronic): Secondary | ICD-10-CM | POA: Diagnosis not present

## 2012-04-03 DIAGNOSIS — D500 Iron deficiency anemia secondary to blood loss (chronic): Secondary | ICD-10-CM | POA: Diagnosis not present

## 2012-04-17 DIAGNOSIS — Z79899 Other long term (current) drug therapy: Secondary | ICD-10-CM | POA: Diagnosis not present

## 2012-04-17 DIAGNOSIS — D649 Anemia, unspecified: Secondary | ICD-10-CM | POA: Diagnosis not present

## 2012-04-17 DIAGNOSIS — E039 Hypothyroidism, unspecified: Secondary | ICD-10-CM | POA: Diagnosis not present

## 2012-04-17 DIAGNOSIS — I10 Essential (primary) hypertension: Secondary | ICD-10-CM | POA: Diagnosis not present

## 2012-05-01 DIAGNOSIS — D500 Iron deficiency anemia secondary to blood loss (chronic): Secondary | ICD-10-CM | POA: Diagnosis not present

## 2012-05-29 DIAGNOSIS — J069 Acute upper respiratory infection, unspecified: Secondary | ICD-10-CM | POA: Diagnosis not present

## 2012-06-01 DIAGNOSIS — I7300 Raynaud's syndrome without gangrene: Secondary | ICD-10-CM | POA: Diagnosis not present

## 2012-06-01 DIAGNOSIS — M19019 Primary osteoarthritis, unspecified shoulder: Secondary | ICD-10-CM | POA: Diagnosis not present

## 2012-06-01 DIAGNOSIS — M1710 Unilateral primary osteoarthritis, unspecified knee: Secondary | ICD-10-CM | POA: Diagnosis not present

## 2012-06-20 DIAGNOSIS — M359 Systemic involvement of connective tissue, unspecified: Secondary | ICD-10-CM | POA: Diagnosis not present

## 2012-06-20 DIAGNOSIS — J069 Acute upper respiratory infection, unspecified: Secondary | ICD-10-CM | POA: Diagnosis not present

## 2012-09-07 DIAGNOSIS — J209 Acute bronchitis, unspecified: Secondary | ICD-10-CM | POA: Diagnosis not present

## 2012-10-05 DIAGNOSIS — I10 Essential (primary) hypertension: Secondary | ICD-10-CM | POA: Diagnosis not present

## 2012-10-05 DIAGNOSIS — M899 Disorder of bone, unspecified: Secondary | ICD-10-CM | POA: Diagnosis not present

## 2012-10-05 DIAGNOSIS — Z79899 Other long term (current) drug therapy: Secondary | ICD-10-CM | POA: Diagnosis not present

## 2012-10-05 DIAGNOSIS — I6529 Occlusion and stenosis of unspecified carotid artery: Secondary | ICD-10-CM | POA: Diagnosis not present

## 2012-10-05 DIAGNOSIS — E039 Hypothyroidism, unspecified: Secondary | ICD-10-CM | POA: Diagnosis not present

## 2012-10-05 DIAGNOSIS — D539 Nutritional anemia, unspecified: Secondary | ICD-10-CM | POA: Diagnosis not present

## 2012-10-05 DIAGNOSIS — M949 Disorder of cartilage, unspecified: Secondary | ICD-10-CM | POA: Diagnosis not present

## 2012-10-12 DIAGNOSIS — Z1231 Encounter for screening mammogram for malignant neoplasm of breast: Secondary | ICD-10-CM | POA: Diagnosis not present

## 2012-10-23 DIAGNOSIS — I6529 Occlusion and stenosis of unspecified carotid artery: Secondary | ICD-10-CM | POA: Diagnosis not present

## 2012-11-05 DIAGNOSIS — Z23 Encounter for immunization: Secondary | ICD-10-CM | POA: Diagnosis not present

## 2012-12-14 DIAGNOSIS — H26499 Other secondary cataract, unspecified eye: Secondary | ICD-10-CM | POA: Diagnosis not present

## 2012-12-14 DIAGNOSIS — H04129 Dry eye syndrome of unspecified lacrimal gland: Secondary | ICD-10-CM | POA: Diagnosis not present

## 2013-03-19 DIAGNOSIS — R609 Edema, unspecified: Secondary | ICD-10-CM | POA: Diagnosis not present

## 2013-03-19 DIAGNOSIS — Z0000 Encounter for general adult medical examination without abnormal findings: Secondary | ICD-10-CM | POA: Diagnosis not present

## 2013-03-19 DIAGNOSIS — R0989 Other specified symptoms and signs involving the circulatory and respiratory systems: Secondary | ICD-10-CM | POA: Diagnosis not present

## 2013-03-19 DIAGNOSIS — L659 Nonscarring hair loss, unspecified: Secondary | ICD-10-CM | POA: Diagnosis not present

## 2013-03-19 DIAGNOSIS — I10 Essential (primary) hypertension: Secondary | ICD-10-CM | POA: Diagnosis not present

## 2013-03-19 DIAGNOSIS — E7800 Pure hypercholesterolemia, unspecified: Secondary | ICD-10-CM | POA: Diagnosis not present

## 2013-03-19 DIAGNOSIS — R0609 Other forms of dyspnea: Secondary | ICD-10-CM | POA: Diagnosis not present

## 2013-03-19 DIAGNOSIS — Z79899 Other long term (current) drug therapy: Secondary | ICD-10-CM | POA: Diagnosis not present

## 2013-04-09 DIAGNOSIS — R0609 Other forms of dyspnea: Secondary | ICD-10-CM | POA: Diagnosis not present

## 2013-04-09 DIAGNOSIS — R0989 Other specified symptoms and signs involving the circulatory and respiratory systems: Secondary | ICD-10-CM | POA: Diagnosis not present

## 2013-04-09 DIAGNOSIS — I10 Essential (primary) hypertension: Secondary | ICD-10-CM | POA: Diagnosis not present

## 2013-04-09 DIAGNOSIS — R609 Edema, unspecified: Secondary | ICD-10-CM | POA: Diagnosis not present

## 2013-05-08 DIAGNOSIS — R0989 Other specified symptoms and signs involving the circulatory and respiratory systems: Secondary | ICD-10-CM | POA: Diagnosis not present

## 2013-05-08 DIAGNOSIS — R609 Edema, unspecified: Secondary | ICD-10-CM | POA: Diagnosis not present

## 2013-05-08 DIAGNOSIS — R0609 Other forms of dyspnea: Secondary | ICD-10-CM | POA: Diagnosis not present

## 2013-05-15 DIAGNOSIS — R0602 Shortness of breath: Secondary | ICD-10-CM | POA: Diagnosis not present

## 2013-05-15 DIAGNOSIS — R609 Edema, unspecified: Secondary | ICD-10-CM | POA: Diagnosis not present

## 2013-05-15 DIAGNOSIS — I10 Essential (primary) hypertension: Secondary | ICD-10-CM | POA: Diagnosis not present

## 2013-05-15 DIAGNOSIS — R5381 Other malaise: Secondary | ICD-10-CM | POA: Diagnosis not present

## 2013-05-22 DIAGNOSIS — D539 Nutritional anemia, unspecified: Secondary | ICD-10-CM | POA: Diagnosis not present

## 2013-05-22 DIAGNOSIS — I10 Essential (primary) hypertension: Secondary | ICD-10-CM | POA: Diagnosis not present

## 2013-05-22 DIAGNOSIS — D649 Anemia, unspecified: Secondary | ICD-10-CM | POA: Diagnosis not present

## 2013-06-24 DIAGNOSIS — I119 Hypertensive heart disease without heart failure: Secondary | ICD-10-CM | POA: Diagnosis not present

## 2013-06-24 DIAGNOSIS — R609 Edema, unspecified: Secondary | ICD-10-CM | POA: Diagnosis not present

## 2013-06-24 DIAGNOSIS — R0602 Shortness of breath: Secondary | ICD-10-CM | POA: Diagnosis not present

## 2013-09-19 DIAGNOSIS — I10 Essential (primary) hypertension: Secondary | ICD-10-CM | POA: Diagnosis not present

## 2013-09-19 DIAGNOSIS — Z23 Encounter for immunization: Secondary | ICD-10-CM | POA: Diagnosis not present

## 2013-09-19 DIAGNOSIS — D510 Vitamin B12 deficiency anemia due to intrinsic factor deficiency: Secondary | ICD-10-CM | POA: Diagnosis not present

## 2013-09-19 DIAGNOSIS — E559 Vitamin D deficiency, unspecified: Secondary | ICD-10-CM | POA: Diagnosis not present

## 2013-09-19 DIAGNOSIS — D649 Anemia, unspecified: Secondary | ICD-10-CM | POA: Diagnosis not present

## 2013-09-19 DIAGNOSIS — E782 Mixed hyperlipidemia: Secondary | ICD-10-CM | POA: Diagnosis not present

## 2013-09-19 DIAGNOSIS — R269 Unspecified abnormalities of gait and mobility: Secondary | ICD-10-CM | POA: Diagnosis not present

## 2013-10-09 DIAGNOSIS — E049 Nontoxic goiter, unspecified: Secondary | ICD-10-CM | POA: Diagnosis not present

## 2013-10-18 DIAGNOSIS — K921 Melena: Secondary | ICD-10-CM | POA: Diagnosis not present

## 2013-10-18 DIAGNOSIS — K922 Gastrointestinal hemorrhage, unspecified: Secondary | ICD-10-CM | POA: Diagnosis not present

## 2013-10-18 DIAGNOSIS — D500 Iron deficiency anemia secondary to blood loss (chronic): Secondary | ICD-10-CM | POA: Diagnosis not present

## 2013-11-01 DIAGNOSIS — Z1231 Encounter for screening mammogram for malignant neoplasm of breast: Secondary | ICD-10-CM | POA: Diagnosis not present

## 2013-11-01 DIAGNOSIS — D509 Iron deficiency anemia, unspecified: Secondary | ICD-10-CM | POA: Diagnosis not present

## 2013-11-13 DIAGNOSIS — D509 Iron deficiency anemia, unspecified: Secondary | ICD-10-CM | POA: Diagnosis not present

## 2013-11-25 DIAGNOSIS — D509 Iron deficiency anemia, unspecified: Secondary | ICD-10-CM | POA: Diagnosis not present

## 2013-11-25 DIAGNOSIS — R195 Other fecal abnormalities: Secondary | ICD-10-CM | POA: Diagnosis not present

## 2014-01-02 DIAGNOSIS — H26499 Other secondary cataract, unspecified eye: Secondary | ICD-10-CM | POA: Diagnosis not present

## 2014-01-02 DIAGNOSIS — H04129 Dry eye syndrome of unspecified lacrimal gland: Secondary | ICD-10-CM | POA: Diagnosis not present

## 2014-01-02 DIAGNOSIS — H00029 Hordeolum internum unspecified eye, unspecified eyelid: Secondary | ICD-10-CM | POA: Diagnosis not present

## 2014-01-10 DIAGNOSIS — D509 Iron deficiency anemia, unspecified: Secondary | ICD-10-CM | POA: Diagnosis not present

## 2014-01-13 DIAGNOSIS — Z09 Encounter for follow-up examination after completed treatment for conditions other than malignant neoplasm: Secondary | ICD-10-CM | POA: Diagnosis not present

## 2014-01-13 DIAGNOSIS — D509 Iron deficiency anemia, unspecified: Secondary | ICD-10-CM | POA: Diagnosis not present

## 2014-02-06 DIAGNOSIS — E039 Hypothyroidism, unspecified: Secondary | ICD-10-CM | POA: Insufficient documentation

## 2014-02-06 DIAGNOSIS — I10 Essential (primary) hypertension: Secondary | ICD-10-CM | POA: Insufficient documentation

## 2014-02-06 DIAGNOSIS — N183 Chronic kidney disease, stage 3 unspecified: Secondary | ICD-10-CM | POA: Insufficient documentation

## 2014-02-06 DIAGNOSIS — K219 Gastro-esophageal reflux disease without esophagitis: Secondary | ICD-10-CM | POA: Insufficient documentation

## 2014-02-06 DIAGNOSIS — E669 Obesity, unspecified: Secondary | ICD-10-CM | POA: Insufficient documentation

## 2014-02-13 DIAGNOSIS — I129 Hypertensive chronic kidney disease with stage 1 through stage 4 chronic kidney disease, or unspecified chronic kidney disease: Secondary | ICD-10-CM | POA: Diagnosis not present

## 2014-02-13 DIAGNOSIS — K219 Gastro-esophageal reflux disease without esophagitis: Secondary | ICD-10-CM | POA: Diagnosis not present

## 2014-02-13 DIAGNOSIS — D509 Iron deficiency anemia, unspecified: Secondary | ICD-10-CM | POA: Diagnosis not present

## 2014-02-13 DIAGNOSIS — Z87891 Personal history of nicotine dependence: Secondary | ICD-10-CM | POA: Diagnosis not present

## 2014-02-13 DIAGNOSIS — K2990 Gastroduodenitis, unspecified, without bleeding: Secondary | ICD-10-CM | POA: Diagnosis not present

## 2014-02-13 DIAGNOSIS — N183 Chronic kidney disease, stage 3 unspecified: Secondary | ICD-10-CM | POA: Diagnosis not present

## 2014-02-13 DIAGNOSIS — Z9889 Other specified postprocedural states: Secondary | ICD-10-CM | POA: Diagnosis not present

## 2014-02-13 DIAGNOSIS — R6889 Other general symptoms and signs: Secondary | ICD-10-CM | POA: Diagnosis not present

## 2014-02-13 DIAGNOSIS — K922 Gastrointestinal hemorrhage, unspecified: Secondary | ICD-10-CM | POA: Diagnosis not present

## 2014-02-13 DIAGNOSIS — E039 Hypothyroidism, unspecified: Secondary | ICD-10-CM | POA: Diagnosis not present

## 2014-02-13 DIAGNOSIS — K449 Diaphragmatic hernia without obstruction or gangrene: Secondary | ICD-10-CM | POA: Diagnosis not present

## 2014-02-13 DIAGNOSIS — K2970 Gastritis, unspecified, without bleeding: Secondary | ICD-10-CM | POA: Diagnosis not present

## 2014-02-13 DIAGNOSIS — K921 Melena: Secondary | ICD-10-CM | POA: Diagnosis not present

## 2014-02-13 DIAGNOSIS — K31819 Angiodysplasia of stomach and duodenum without bleeding: Secondary | ICD-10-CM | POA: Diagnosis not present

## 2014-02-13 DIAGNOSIS — Z888 Allergy status to other drugs, medicaments and biological substances status: Secondary | ICD-10-CM | POA: Diagnosis not present

## 2014-03-18 DIAGNOSIS — D500 Iron deficiency anemia secondary to blood loss (chronic): Secondary | ICD-10-CM | POA: Diagnosis not present

## 2014-04-08 DIAGNOSIS — K6389 Other specified diseases of intestine: Secondary | ICD-10-CM | POA: Diagnosis not present

## 2014-04-08 DIAGNOSIS — D500 Iron deficiency anemia secondary to blood loss (chronic): Secondary | ICD-10-CM | POA: Diagnosis not present

## 2014-04-28 DIAGNOSIS — R1113 Vomiting of fecal matter: Secondary | ICD-10-CM | POA: Diagnosis not present

## 2014-04-28 DIAGNOSIS — D500 Iron deficiency anemia secondary to blood loss (chronic): Secondary | ICD-10-CM | POA: Diagnosis not present

## 2014-05-06 DIAGNOSIS — Z79899 Other long term (current) drug therapy: Secondary | ICD-10-CM | POA: Diagnosis not present

## 2014-05-08 DIAGNOSIS — I10 Essential (primary) hypertension: Secondary | ICD-10-CM | POA: Diagnosis not present

## 2014-05-08 DIAGNOSIS — E782 Mixed hyperlipidemia: Secondary | ICD-10-CM | POA: Diagnosis not present

## 2014-05-08 DIAGNOSIS — R609 Edema, unspecified: Secondary | ICD-10-CM | POA: Diagnosis not present

## 2014-05-13 DIAGNOSIS — D509 Iron deficiency anemia, unspecified: Secondary | ICD-10-CM | POA: Diagnosis not present

## 2014-05-15 DIAGNOSIS — D509 Iron deficiency anemia, unspecified: Secondary | ICD-10-CM | POA: Diagnosis not present

## 2014-05-15 DIAGNOSIS — Q2733 Arteriovenous malformation of digestive system vessel: Secondary | ICD-10-CM | POA: Diagnosis not present

## 2014-05-15 DIAGNOSIS — Z09 Encounter for follow-up examination after completed treatment for conditions other than malignant neoplasm: Secondary | ICD-10-CM | POA: Diagnosis not present

## 2014-07-01 DIAGNOSIS — R079 Chest pain, unspecified: Secondary | ICD-10-CM | POA: Diagnosis not present

## 2014-07-01 DIAGNOSIS — R109 Unspecified abdominal pain: Secondary | ICD-10-CM | POA: Diagnosis not present

## 2014-07-03 DIAGNOSIS — R5381 Other malaise: Secondary | ICD-10-CM | POA: Diagnosis not present

## 2014-07-03 DIAGNOSIS — R1013 Epigastric pain: Secondary | ICD-10-CM | POA: Diagnosis not present

## 2014-07-03 DIAGNOSIS — R079 Chest pain, unspecified: Secondary | ICD-10-CM | POA: Diagnosis not present

## 2014-07-03 DIAGNOSIS — R109 Unspecified abdominal pain: Secondary | ICD-10-CM | POA: Diagnosis not present

## 2014-07-03 DIAGNOSIS — K8020 Calculus of gallbladder without cholecystitis without obstruction: Secondary | ICD-10-CM | POA: Diagnosis not present

## 2014-07-03 DIAGNOSIS — R5383 Other fatigue: Secondary | ICD-10-CM | POA: Diagnosis not present

## 2014-07-03 DIAGNOSIS — R110 Nausea: Secondary | ICD-10-CM | POA: Diagnosis not present

## 2014-07-03 DIAGNOSIS — R0789 Other chest pain: Secondary | ICD-10-CM | POA: Diagnosis not present

## 2014-07-03 DIAGNOSIS — R0602 Shortness of breath: Secondary | ICD-10-CM | POA: Diagnosis not present

## 2014-07-14 DIAGNOSIS — K8010 Calculus of gallbladder with chronic cholecystitis without obstruction: Secondary | ICD-10-CM | POA: Diagnosis not present

## 2014-07-14 DIAGNOSIS — E669 Obesity, unspecified: Secondary | ICD-10-CM | POA: Diagnosis not present

## 2014-07-14 DIAGNOSIS — Z6832 Body mass index (BMI) 32.0-32.9, adult: Secondary | ICD-10-CM | POA: Diagnosis not present

## 2014-07-23 DIAGNOSIS — R932 Abnormal findings on diagnostic imaging of liver and biliary tract: Secondary | ICD-10-CM | POA: Diagnosis not present

## 2014-07-23 DIAGNOSIS — Z6832 Body mass index (BMI) 32.0-32.9, adult: Secondary | ICD-10-CM | POA: Diagnosis not present

## 2014-07-23 DIAGNOSIS — I10 Essential (primary) hypertension: Secondary | ICD-10-CM | POA: Diagnosis not present

## 2014-07-23 DIAGNOSIS — K8010 Calculus of gallbladder with chronic cholecystitis without obstruction: Secondary | ICD-10-CM | POA: Diagnosis not present

## 2014-07-23 DIAGNOSIS — E669 Obesity, unspecified: Secondary | ICD-10-CM | POA: Diagnosis not present

## 2014-07-23 DIAGNOSIS — Z79899 Other long term (current) drug therapy: Secondary | ICD-10-CM | POA: Diagnosis not present

## 2014-07-23 DIAGNOSIS — K219 Gastro-esophageal reflux disease without esophagitis: Secondary | ICD-10-CM | POA: Diagnosis not present

## 2014-07-23 DIAGNOSIS — E785 Hyperlipidemia, unspecified: Secondary | ICD-10-CM | POA: Diagnosis not present

## 2014-07-23 DIAGNOSIS — E0590 Thyrotoxicosis, unspecified without thyrotoxic crisis or storm: Secondary | ICD-10-CM | POA: Diagnosis not present

## 2014-09-16 DIAGNOSIS — D500 Iron deficiency anemia secondary to blood loss (chronic): Secondary | ICD-10-CM | POA: Diagnosis not present

## 2014-11-12 DIAGNOSIS — I10 Essential (primary) hypertension: Secondary | ICD-10-CM | POA: Diagnosis not present

## 2014-11-12 DIAGNOSIS — E782 Mixed hyperlipidemia: Secondary | ICD-10-CM | POA: Diagnosis not present

## 2014-11-12 DIAGNOSIS — M8580 Other specified disorders of bone density and structure, unspecified site: Secondary | ICD-10-CM | POA: Diagnosis not present

## 2014-11-12 DIAGNOSIS — Z0000 Encounter for general adult medical examination without abnormal findings: Secondary | ICD-10-CM | POA: Diagnosis not present

## 2014-11-12 DIAGNOSIS — D510 Vitamin B12 deficiency anemia due to intrinsic factor deficiency: Secondary | ICD-10-CM | POA: Diagnosis not present

## 2014-11-20 DIAGNOSIS — M1611 Unilateral primary osteoarthritis, right hip: Secondary | ICD-10-CM | POA: Diagnosis not present

## 2014-11-24 DIAGNOSIS — Z79899 Other long term (current) drug therapy: Secondary | ICD-10-CM | POA: Diagnosis not present

## 2014-11-24 DIAGNOSIS — R9431 Abnormal electrocardiogram [ECG] [EKG]: Secondary | ICD-10-CM | POA: Diagnosis not present

## 2014-11-24 DIAGNOSIS — M79609 Pain in unspecified limb: Secondary | ICD-10-CM | POA: Diagnosis not present

## 2014-11-24 DIAGNOSIS — Z01818 Encounter for other preprocedural examination: Secondary | ICD-10-CM | POA: Diagnosis not present

## 2014-12-08 DIAGNOSIS — R0789 Other chest pain: Secondary | ICD-10-CM | POA: Diagnosis not present

## 2014-12-10 DIAGNOSIS — E039 Hypothyroidism, unspecified: Secondary | ICD-10-CM | POA: Diagnosis not present

## 2014-12-10 DIAGNOSIS — Z888 Allergy status to other drugs, medicaments and biological substances status: Secondary | ICD-10-CM | POA: Diagnosis not present

## 2014-12-10 DIAGNOSIS — E785 Hyperlipidemia, unspecified: Secondary | ICD-10-CM | POA: Diagnosis present

## 2014-12-10 DIAGNOSIS — Z96641 Presence of right artificial hip joint: Secondary | ICD-10-CM | POA: Diagnosis not present

## 2014-12-10 DIAGNOSIS — Z880 Allergy status to penicillin: Secondary | ICD-10-CM | POA: Diagnosis not present

## 2014-12-10 DIAGNOSIS — Z87891 Personal history of nicotine dependence: Secondary | ICD-10-CM | POA: Diagnosis not present

## 2014-12-10 DIAGNOSIS — M1990 Unspecified osteoarthritis, unspecified site: Secondary | ICD-10-CM | POA: Diagnosis not present

## 2014-12-10 DIAGNOSIS — Z471 Aftercare following joint replacement surgery: Secondary | ICD-10-CM | POA: Diagnosis not present

## 2014-12-10 DIAGNOSIS — M25551 Pain in right hip: Secondary | ICD-10-CM | POA: Diagnosis not present

## 2014-12-10 DIAGNOSIS — M1611 Unilateral primary osteoarthritis, right hip: Secondary | ICD-10-CM | POA: Diagnosis not present

## 2014-12-10 DIAGNOSIS — E669 Obesity, unspecified: Secondary | ICD-10-CM | POA: Diagnosis not present

## 2014-12-10 DIAGNOSIS — Z23 Encounter for immunization: Secondary | ICD-10-CM | POA: Diagnosis not present

## 2014-12-10 DIAGNOSIS — I10 Essential (primary) hypertension: Secondary | ICD-10-CM | POA: Diagnosis not present

## 2014-12-10 DIAGNOSIS — M1711 Unilateral primary osteoarthritis, right knee: Secondary | ICD-10-CM | POA: Diagnosis not present

## 2014-12-10 DIAGNOSIS — D519 Vitamin B12 deficiency anemia, unspecified: Secondary | ICD-10-CM | POA: Diagnosis not present

## 2014-12-13 DIAGNOSIS — Z96641 Presence of right artificial hip joint: Secondary | ICD-10-CM | POA: Diagnosis not present

## 2014-12-13 DIAGNOSIS — E782 Mixed hyperlipidemia: Secondary | ICD-10-CM | POA: Diagnosis not present

## 2014-12-13 DIAGNOSIS — Z23 Encounter for immunization: Secondary | ICD-10-CM | POA: Diagnosis not present

## 2014-12-13 DIAGNOSIS — I10 Essential (primary) hypertension: Secondary | ICD-10-CM | POA: Diagnosis not present

## 2014-12-13 DIAGNOSIS — Z87891 Personal history of nicotine dependence: Secondary | ICD-10-CM | POA: Diagnosis not present

## 2014-12-13 DIAGNOSIS — M1990 Unspecified osteoarthritis, unspecified site: Secondary | ICD-10-CM | POA: Diagnosis not present

## 2014-12-13 DIAGNOSIS — Z880 Allergy status to penicillin: Secondary | ICD-10-CM | POA: Diagnosis not present

## 2014-12-13 DIAGNOSIS — Z471 Aftercare following joint replacement surgery: Secondary | ICD-10-CM | POA: Diagnosis not present

## 2014-12-13 DIAGNOSIS — D510 Vitamin B12 deficiency anemia due to intrinsic factor deficiency: Secondary | ICD-10-CM | POA: Diagnosis not present

## 2014-12-13 DIAGNOSIS — Z888 Allergy status to other drugs, medicaments and biological substances status: Secondary | ICD-10-CM | POA: Diagnosis not present

## 2014-12-13 DIAGNOSIS — M1611 Unilateral primary osteoarthritis, right hip: Secondary | ICD-10-CM | POA: Diagnosis not present

## 2014-12-13 DIAGNOSIS — E039 Hypothyroidism, unspecified: Secondary | ICD-10-CM | POA: Diagnosis not present

## 2014-12-13 DIAGNOSIS — D519 Vitamin B12 deficiency anemia, unspecified: Secondary | ICD-10-CM | POA: Diagnosis not present

## 2014-12-13 DIAGNOSIS — E669 Obesity, unspecified: Secondary | ICD-10-CM | POA: Diagnosis not present

## 2014-12-13 DIAGNOSIS — E785 Hyperlipidemia, unspecified: Secondary | ICD-10-CM | POA: Diagnosis not present

## 2014-12-15 DIAGNOSIS — E782 Mixed hyperlipidemia: Secondary | ICD-10-CM | POA: Diagnosis not present

## 2014-12-15 DIAGNOSIS — D510 Vitamin B12 deficiency anemia due to intrinsic factor deficiency: Secondary | ICD-10-CM | POA: Diagnosis not present

## 2014-12-15 DIAGNOSIS — I10 Essential (primary) hypertension: Secondary | ICD-10-CM | POA: Diagnosis not present

## 2014-12-22 DIAGNOSIS — Z471 Aftercare following joint replacement surgery: Secondary | ICD-10-CM | POA: Diagnosis not present

## 2014-12-22 DIAGNOSIS — Z7901 Long term (current) use of anticoagulants: Secondary | ICD-10-CM | POA: Diagnosis not present

## 2014-12-22 DIAGNOSIS — Z96641 Presence of right artificial hip joint: Secondary | ICD-10-CM | POA: Diagnosis not present

## 2014-12-22 DIAGNOSIS — I10 Essential (primary) hypertension: Secondary | ICD-10-CM | POA: Diagnosis not present

## 2014-12-23 DIAGNOSIS — Z7901 Long term (current) use of anticoagulants: Secondary | ICD-10-CM | POA: Diagnosis not present

## 2014-12-23 DIAGNOSIS — I10 Essential (primary) hypertension: Secondary | ICD-10-CM | POA: Diagnosis not present

## 2014-12-23 DIAGNOSIS — Z96641 Presence of right artificial hip joint: Secondary | ICD-10-CM | POA: Diagnosis not present

## 2014-12-23 DIAGNOSIS — Z471 Aftercare following joint replacement surgery: Secondary | ICD-10-CM | POA: Diagnosis not present

## 2014-12-24 DIAGNOSIS — Z96641 Presence of right artificial hip joint: Secondary | ICD-10-CM | POA: Diagnosis not present

## 2014-12-24 DIAGNOSIS — Z7901 Long term (current) use of anticoagulants: Secondary | ICD-10-CM | POA: Diagnosis not present

## 2014-12-24 DIAGNOSIS — I10 Essential (primary) hypertension: Secondary | ICD-10-CM | POA: Diagnosis not present

## 2014-12-24 DIAGNOSIS — Z471 Aftercare following joint replacement surgery: Secondary | ICD-10-CM | POA: Diagnosis not present

## 2014-12-25 DIAGNOSIS — Z471 Aftercare following joint replacement surgery: Secondary | ICD-10-CM | POA: Diagnosis not present

## 2014-12-25 DIAGNOSIS — Z96641 Presence of right artificial hip joint: Secondary | ICD-10-CM | POA: Diagnosis not present

## 2014-12-25 DIAGNOSIS — I10 Essential (primary) hypertension: Secondary | ICD-10-CM | POA: Diagnosis not present

## 2014-12-25 DIAGNOSIS — Z7901 Long term (current) use of anticoagulants: Secondary | ICD-10-CM | POA: Diagnosis not present

## 2014-12-29 DIAGNOSIS — I10 Essential (primary) hypertension: Secondary | ICD-10-CM | POA: Diagnosis not present

## 2014-12-29 DIAGNOSIS — Z96641 Presence of right artificial hip joint: Secondary | ICD-10-CM | POA: Diagnosis not present

## 2014-12-29 DIAGNOSIS — Z471 Aftercare following joint replacement surgery: Secondary | ICD-10-CM | POA: Diagnosis not present

## 2014-12-29 DIAGNOSIS — Z7901 Long term (current) use of anticoagulants: Secondary | ICD-10-CM | POA: Diagnosis not present

## 2014-12-30 DIAGNOSIS — I10 Essential (primary) hypertension: Secondary | ICD-10-CM | POA: Diagnosis not present

## 2014-12-30 DIAGNOSIS — Z96641 Presence of right artificial hip joint: Secondary | ICD-10-CM | POA: Diagnosis not present

## 2014-12-30 DIAGNOSIS — Z471 Aftercare following joint replacement surgery: Secondary | ICD-10-CM | POA: Diagnosis not present

## 2014-12-30 DIAGNOSIS — Z7901 Long term (current) use of anticoagulants: Secondary | ICD-10-CM | POA: Diagnosis not present

## 2015-01-01 DIAGNOSIS — Z96641 Presence of right artificial hip joint: Secondary | ICD-10-CM | POA: Diagnosis not present

## 2015-01-01 DIAGNOSIS — Z471 Aftercare following joint replacement surgery: Secondary | ICD-10-CM | POA: Diagnosis not present

## 2015-01-01 DIAGNOSIS — Z7901 Long term (current) use of anticoagulants: Secondary | ICD-10-CM | POA: Diagnosis not present

## 2015-01-01 DIAGNOSIS — I10 Essential (primary) hypertension: Secondary | ICD-10-CM | POA: Diagnosis not present

## 2015-01-05 DIAGNOSIS — Z96641 Presence of right artificial hip joint: Secondary | ICD-10-CM | POA: Diagnosis not present

## 2015-01-05 DIAGNOSIS — D500 Iron deficiency anemia secondary to blood loss (chronic): Secondary | ICD-10-CM | POA: Diagnosis not present

## 2015-01-05 DIAGNOSIS — Z471 Aftercare following joint replacement surgery: Secondary | ICD-10-CM | POA: Diagnosis not present

## 2015-01-05 DIAGNOSIS — I10 Essential (primary) hypertension: Secondary | ICD-10-CM | POA: Diagnosis not present

## 2015-01-05 DIAGNOSIS — Z7901 Long term (current) use of anticoagulants: Secondary | ICD-10-CM | POA: Diagnosis not present

## 2015-01-07 DIAGNOSIS — Z7901 Long term (current) use of anticoagulants: Secondary | ICD-10-CM | POA: Diagnosis not present

## 2015-01-07 DIAGNOSIS — Z96641 Presence of right artificial hip joint: Secondary | ICD-10-CM | POA: Diagnosis not present

## 2015-01-07 DIAGNOSIS — Z471 Aftercare following joint replacement surgery: Secondary | ICD-10-CM | POA: Diagnosis not present

## 2015-01-07 DIAGNOSIS — I10 Essential (primary) hypertension: Secondary | ICD-10-CM | POA: Diagnosis not present

## 2015-01-13 DIAGNOSIS — Z96641 Presence of right artificial hip joint: Secondary | ICD-10-CM | POA: Diagnosis not present

## 2015-01-13 DIAGNOSIS — Z7901 Long term (current) use of anticoagulants: Secondary | ICD-10-CM | POA: Diagnosis not present

## 2015-01-13 DIAGNOSIS — Z471 Aftercare following joint replacement surgery: Secondary | ICD-10-CM | POA: Diagnosis not present

## 2015-01-13 DIAGNOSIS — I10 Essential (primary) hypertension: Secondary | ICD-10-CM | POA: Diagnosis not present

## 2015-01-22 DIAGNOSIS — M1611 Unilateral primary osteoarthritis, right hip: Secondary | ICD-10-CM | POA: Diagnosis not present

## 2015-02-17 DIAGNOSIS — Z9181 History of falling: Secondary | ICD-10-CM | POA: Diagnosis not present

## 2015-02-17 DIAGNOSIS — Z1389 Encounter for screening for other disorder: Secondary | ICD-10-CM | POA: Diagnosis not present

## 2015-02-17 DIAGNOSIS — J00 Acute nasopharyngitis [common cold]: Secondary | ICD-10-CM | POA: Diagnosis not present

## 2015-03-17 DIAGNOSIS — D500 Iron deficiency anemia secondary to blood loss (chronic): Secondary | ICD-10-CM | POA: Diagnosis not present

## 2015-04-17 DIAGNOSIS — K5520 Angiodysplasia of colon without hemorrhage: Secondary | ICD-10-CM | POA: Diagnosis not present

## 2015-04-17 DIAGNOSIS — K625 Hemorrhage of anus and rectum: Secondary | ICD-10-CM | POA: Diagnosis not present

## 2015-04-27 DIAGNOSIS — Z96649 Presence of unspecified artificial hip joint: Secondary | ICD-10-CM | POA: Diagnosis not present

## 2015-04-30 DIAGNOSIS — M67911 Unspecified disorder of synovium and tendon, right shoulder: Secondary | ICD-10-CM | POA: Diagnosis not present

## 2015-05-04 DIAGNOSIS — M25511 Pain in right shoulder: Secondary | ICD-10-CM | POA: Diagnosis not present

## 2015-05-04 DIAGNOSIS — L02415 Cutaneous abscess of right lower limb: Secondary | ICD-10-CM | POA: Diagnosis not present

## 2015-05-04 DIAGNOSIS — M67911 Unspecified disorder of synovium and tendon, right shoulder: Secondary | ICD-10-CM | POA: Diagnosis not present

## 2015-05-06 DIAGNOSIS — M67911 Unspecified disorder of synovium and tendon, right shoulder: Secondary | ICD-10-CM | POA: Diagnosis not present

## 2015-05-06 DIAGNOSIS — M25511 Pain in right shoulder: Secondary | ICD-10-CM | POA: Diagnosis not present

## 2015-05-08 DIAGNOSIS — M67911 Unspecified disorder of synovium and tendon, right shoulder: Secondary | ICD-10-CM | POA: Diagnosis not present

## 2015-05-08 DIAGNOSIS — M25511 Pain in right shoulder: Secondary | ICD-10-CM | POA: Diagnosis not present

## 2015-05-12 DIAGNOSIS — M25511 Pain in right shoulder: Secondary | ICD-10-CM | POA: Diagnosis not present

## 2015-05-12 DIAGNOSIS — M67911 Unspecified disorder of synovium and tendon, right shoulder: Secondary | ICD-10-CM | POA: Diagnosis not present

## 2015-05-14 DIAGNOSIS — M25511 Pain in right shoulder: Secondary | ICD-10-CM | POA: Diagnosis not present

## 2015-05-14 DIAGNOSIS — M67911 Unspecified disorder of synovium and tendon, right shoulder: Secondary | ICD-10-CM | POA: Diagnosis not present

## 2015-05-19 DIAGNOSIS — M67911 Unspecified disorder of synovium and tendon, right shoulder: Secondary | ICD-10-CM | POA: Diagnosis not present

## 2015-05-19 DIAGNOSIS — M25511 Pain in right shoulder: Secondary | ICD-10-CM | POA: Diagnosis not present

## 2015-05-21 DIAGNOSIS — M25511 Pain in right shoulder: Secondary | ICD-10-CM | POA: Diagnosis not present

## 2015-05-21 DIAGNOSIS — M67911 Unspecified disorder of synovium and tendon, right shoulder: Secondary | ICD-10-CM | POA: Diagnosis not present

## 2015-05-25 DIAGNOSIS — M67911 Unspecified disorder of synovium and tendon, right shoulder: Secondary | ICD-10-CM | POA: Diagnosis not present

## 2015-05-25 DIAGNOSIS — M25511 Pain in right shoulder: Secondary | ICD-10-CM | POA: Diagnosis not present

## 2015-05-28 ENCOUNTER — Other Ambulatory Visit: Payer: Self-pay

## 2015-05-28 DIAGNOSIS — K635 Polyp of colon: Secondary | ICD-10-CM | POA: Diagnosis not present

## 2015-05-28 DIAGNOSIS — K6389 Other specified diseases of intestine: Secondary | ICD-10-CM | POA: Diagnosis not present

## 2015-05-28 DIAGNOSIS — Z9049 Acquired absence of other specified parts of digestive tract: Secondary | ICD-10-CM | POA: Diagnosis not present

## 2015-05-28 DIAGNOSIS — Z87891 Personal history of nicotine dependence: Secondary | ICD-10-CM | POA: Diagnosis not present

## 2015-05-28 DIAGNOSIS — J449 Chronic obstructive pulmonary disease, unspecified: Secondary | ICD-10-CM | POA: Diagnosis not present

## 2015-05-28 DIAGNOSIS — Z800 Family history of malignant neoplasm of digestive organs: Secondary | ICD-10-CM | POA: Diagnosis not present

## 2015-05-28 DIAGNOSIS — K5520 Angiodysplasia of colon without hemorrhage: Secondary | ICD-10-CM | POA: Diagnosis not present

## 2015-05-28 DIAGNOSIS — K625 Hemorrhage of anus and rectum: Secondary | ICD-10-CM | POA: Diagnosis not present

## 2015-05-28 DIAGNOSIS — E785 Hyperlipidemia, unspecified: Secondary | ICD-10-CM | POA: Diagnosis not present

## 2015-05-28 DIAGNOSIS — K5730 Diverticulosis of large intestine without perforation or abscess without bleeding: Secondary | ICD-10-CM | POA: Diagnosis not present

## 2015-05-28 DIAGNOSIS — F329 Major depressive disorder, single episode, unspecified: Secondary | ICD-10-CM | POA: Diagnosis not present

## 2015-05-28 DIAGNOSIS — I10 Essential (primary) hypertension: Secondary | ICD-10-CM | POA: Diagnosis not present

## 2015-05-28 DIAGNOSIS — D124 Benign neoplasm of descending colon: Secondary | ICD-10-CM | POA: Diagnosis not present

## 2015-05-28 DIAGNOSIS — E039 Hypothyroidism, unspecified: Secondary | ICD-10-CM | POA: Diagnosis not present

## 2015-05-28 DIAGNOSIS — K648 Other hemorrhoids: Secondary | ICD-10-CM | POA: Diagnosis not present

## 2015-05-29 DIAGNOSIS — M25511 Pain in right shoulder: Secondary | ICD-10-CM | POA: Diagnosis not present

## 2015-05-29 DIAGNOSIS — M67911 Unspecified disorder of synovium and tendon, right shoulder: Secondary | ICD-10-CM | POA: Diagnosis not present

## 2015-06-01 DIAGNOSIS — M67911 Unspecified disorder of synovium and tendon, right shoulder: Secondary | ICD-10-CM | POA: Diagnosis not present

## 2015-06-01 DIAGNOSIS — M25511 Pain in right shoulder: Secondary | ICD-10-CM | POA: Diagnosis not present

## 2015-06-03 DIAGNOSIS — L820 Inflamed seborrheic keratosis: Secondary | ICD-10-CM | POA: Diagnosis not present

## 2015-06-04 DIAGNOSIS — M67911 Unspecified disorder of synovium and tendon, right shoulder: Secondary | ICD-10-CM | POA: Diagnosis not present

## 2015-06-04 DIAGNOSIS — M25511 Pain in right shoulder: Secondary | ICD-10-CM | POA: Diagnosis not present

## 2015-06-11 DIAGNOSIS — M25511 Pain in right shoulder: Secondary | ICD-10-CM | POA: Diagnosis not present

## 2015-06-11 DIAGNOSIS — M67911 Unspecified disorder of synovium and tendon, right shoulder: Secondary | ICD-10-CM | POA: Diagnosis not present

## 2015-06-12 DIAGNOSIS — M25511 Pain in right shoulder: Secondary | ICD-10-CM | POA: Diagnosis not present

## 2015-06-15 DIAGNOSIS — M25561 Pain in right knee: Secondary | ICD-10-CM | POA: Diagnosis not present

## 2015-06-15 DIAGNOSIS — I10 Essential (primary) hypertension: Secondary | ICD-10-CM | POA: Diagnosis not present

## 2015-06-15 DIAGNOSIS — E782 Mixed hyperlipidemia: Secondary | ICD-10-CM | POA: Diagnosis not present

## 2015-06-15 DIAGNOSIS — Z96651 Presence of right artificial knee joint: Secondary | ICD-10-CM | POA: Diagnosis not present

## 2015-06-15 DIAGNOSIS — Z79899 Other long term (current) drug therapy: Secondary | ICD-10-CM | POA: Diagnosis not present

## 2015-06-15 DIAGNOSIS — M1711 Unilateral primary osteoarthritis, right knee: Secondary | ICD-10-CM | POA: Diagnosis not present

## 2015-06-18 DIAGNOSIS — M25561 Pain in right knee: Secondary | ICD-10-CM | POA: Diagnosis not present

## 2015-06-18 DIAGNOSIS — R262 Difficulty in walking, not elsewhere classified: Secondary | ICD-10-CM | POA: Diagnosis not present

## 2015-06-18 DIAGNOSIS — M1711 Unilateral primary osteoarthritis, right knee: Secondary | ICD-10-CM | POA: Diagnosis not present

## 2015-06-19 DIAGNOSIS — R6889 Other general symptoms and signs: Secondary | ICD-10-CM | POA: Diagnosis not present

## 2015-06-24 DIAGNOSIS — R262 Difficulty in walking, not elsewhere classified: Secondary | ICD-10-CM | POA: Diagnosis not present

## 2015-06-24 DIAGNOSIS — M1711 Unilateral primary osteoarthritis, right knee: Secondary | ICD-10-CM | POA: Diagnosis not present

## 2015-06-24 DIAGNOSIS — M25561 Pain in right knee: Secondary | ICD-10-CM | POA: Diagnosis not present

## 2015-07-02 DIAGNOSIS — M25561 Pain in right knee: Secondary | ICD-10-CM | POA: Diagnosis not present

## 2015-07-02 DIAGNOSIS — R262 Difficulty in walking, not elsewhere classified: Secondary | ICD-10-CM | POA: Diagnosis not present

## 2015-07-02 DIAGNOSIS — M1711 Unilateral primary osteoarthritis, right knee: Secondary | ICD-10-CM | POA: Diagnosis not present

## 2015-07-09 DIAGNOSIS — M25561 Pain in right knee: Secondary | ICD-10-CM | POA: Diagnosis not present

## 2015-07-09 DIAGNOSIS — R262 Difficulty in walking, not elsewhere classified: Secondary | ICD-10-CM | POA: Diagnosis not present

## 2015-07-09 DIAGNOSIS — M1711 Unilateral primary osteoarthritis, right knee: Secondary | ICD-10-CM | POA: Diagnosis not present

## 2015-07-17 DIAGNOSIS — D500 Iron deficiency anemia secondary to blood loss (chronic): Secondary | ICD-10-CM | POA: Diagnosis not present

## 2015-07-17 DIAGNOSIS — M1711 Unilateral primary osteoarthritis, right knee: Secondary | ICD-10-CM | POA: Diagnosis not present

## 2015-07-17 DIAGNOSIS — M25561 Pain in right knee: Secondary | ICD-10-CM | POA: Diagnosis not present

## 2015-07-17 DIAGNOSIS — R262 Difficulty in walking, not elsewhere classified: Secondary | ICD-10-CM | POA: Diagnosis not present

## 2015-07-21 DIAGNOSIS — M1711 Unilateral primary osteoarthritis, right knee: Secondary | ICD-10-CM | POA: Diagnosis not present

## 2015-07-21 DIAGNOSIS — R262 Difficulty in walking, not elsewhere classified: Secondary | ICD-10-CM | POA: Diagnosis not present

## 2015-07-21 DIAGNOSIS — D509 Iron deficiency anemia, unspecified: Secondary | ICD-10-CM | POA: Diagnosis not present

## 2015-07-21 DIAGNOSIS — M25561 Pain in right knee: Secondary | ICD-10-CM | POA: Diagnosis not present

## 2015-07-30 DIAGNOSIS — M25561 Pain in right knee: Secondary | ICD-10-CM | POA: Diagnosis not present

## 2015-07-30 DIAGNOSIS — Z96651 Presence of right artificial knee joint: Secondary | ICD-10-CM | POA: Diagnosis not present

## 2015-09-17 DIAGNOSIS — R6889 Other general symptoms and signs: Secondary | ICD-10-CM | POA: Diagnosis not present

## 2015-09-17 DIAGNOSIS — Z23 Encounter for immunization: Secondary | ICD-10-CM | POA: Diagnosis not present

## 2015-09-22 DIAGNOSIS — R6889 Other general symptoms and signs: Secondary | ICD-10-CM | POA: Diagnosis not present

## 2015-09-30 DIAGNOSIS — R911 Solitary pulmonary nodule: Secondary | ICD-10-CM | POA: Diagnosis not present

## 2015-10-27 DIAGNOSIS — Z96649 Presence of unspecified artificial hip joint: Secondary | ICD-10-CM | POA: Diagnosis not present

## 2015-11-03 DIAGNOSIS — M5416 Radiculopathy, lumbar region: Secondary | ICD-10-CM | POA: Diagnosis not present

## 2015-11-03 DIAGNOSIS — M545 Low back pain: Secondary | ICD-10-CM | POA: Diagnosis not present

## 2015-11-18 DIAGNOSIS — M4806 Spinal stenosis, lumbar region: Secondary | ICD-10-CM | POA: Diagnosis not present

## 2015-11-18 DIAGNOSIS — M5116 Intervertebral disc disorders with radiculopathy, lumbar region: Secondary | ICD-10-CM | POA: Diagnosis not present

## 2015-11-18 DIAGNOSIS — M5126 Other intervertebral disc displacement, lumbar region: Secondary | ICD-10-CM | POA: Diagnosis not present

## 2015-11-19 DIAGNOSIS — M545 Low back pain: Secondary | ICD-10-CM | POA: Diagnosis not present

## 2015-12-11 DIAGNOSIS — E782 Mixed hyperlipidemia: Secondary | ICD-10-CM | POA: Diagnosis not present

## 2015-12-11 DIAGNOSIS — Z79899 Other long term (current) drug therapy: Secondary | ICD-10-CM | POA: Diagnosis not present

## 2015-12-11 DIAGNOSIS — I10 Essential (primary) hypertension: Secondary | ICD-10-CM | POA: Diagnosis not present

## 2015-12-21 DIAGNOSIS — I10 Essential (primary) hypertension: Secondary | ICD-10-CM | POA: Diagnosis not present

## 2016-01-08 DIAGNOSIS — Z79899 Other long term (current) drug therapy: Secondary | ICD-10-CM | POA: Diagnosis not present

## 2016-01-08 DIAGNOSIS — E782 Mixed hyperlipidemia: Secondary | ICD-10-CM | POA: Diagnosis not present

## 2016-01-08 DIAGNOSIS — I10 Essential (primary) hypertension: Secondary | ICD-10-CM | POA: Diagnosis not present

## 2016-01-08 DIAGNOSIS — Z0000 Encounter for general adult medical examination without abnormal findings: Secondary | ICD-10-CM | POA: Diagnosis not present

## 2016-01-08 DIAGNOSIS — R600 Localized edema: Secondary | ICD-10-CM | POA: Diagnosis not present

## 2016-01-15 DIAGNOSIS — D500 Iron deficiency anemia secondary to blood loss (chronic): Secondary | ICD-10-CM | POA: Diagnosis not present

## 2016-01-18 DIAGNOSIS — D509 Iron deficiency anemia, unspecified: Secondary | ICD-10-CM | POA: Diagnosis not present

## 2016-02-23 DIAGNOSIS — Z1389 Encounter for screening for other disorder: Secondary | ICD-10-CM | POA: Diagnosis not present

## 2016-02-23 DIAGNOSIS — E782 Mixed hyperlipidemia: Secondary | ICD-10-CM | POA: Diagnosis not present

## 2016-02-23 DIAGNOSIS — M859 Disorder of bone density and structure, unspecified: Secondary | ICD-10-CM | POA: Diagnosis not present

## 2016-02-23 DIAGNOSIS — Z13820 Encounter for screening for osteoporosis: Secondary | ICD-10-CM | POA: Diagnosis not present

## 2016-02-23 DIAGNOSIS — Z9181 History of falling: Secondary | ICD-10-CM | POA: Diagnosis not present

## 2016-02-23 DIAGNOSIS — E039 Hypothyroidism, unspecified: Secondary | ICD-10-CM | POA: Diagnosis not present

## 2016-02-23 DIAGNOSIS — M8580 Other specified disorders of bone density and structure, unspecified site: Secondary | ICD-10-CM | POA: Diagnosis not present

## 2016-02-23 DIAGNOSIS — Z23 Encounter for immunization: Secondary | ICD-10-CM | POA: Diagnosis not present

## 2016-03-11 DIAGNOSIS — Z1231 Encounter for screening mammogram for malignant neoplasm of breast: Secondary | ICD-10-CM | POA: Diagnosis not present

## 2016-03-11 DIAGNOSIS — Z803 Family history of malignant neoplasm of breast: Secondary | ICD-10-CM | POA: Diagnosis not present

## 2016-03-25 DIAGNOSIS — M1612 Unilateral primary osteoarthritis, left hip: Secondary | ICD-10-CM | POA: Diagnosis not present

## 2016-03-30 DIAGNOSIS — Z01812 Encounter for preprocedural laboratory examination: Secondary | ICD-10-CM | POA: Diagnosis not present

## 2016-03-30 DIAGNOSIS — M79609 Pain in unspecified limb: Secondary | ICD-10-CM | POA: Diagnosis not present

## 2016-03-30 DIAGNOSIS — Z471 Aftercare following joint replacement surgery: Secondary | ICD-10-CM | POA: Diagnosis not present

## 2016-03-30 DIAGNOSIS — Z79899 Other long term (current) drug therapy: Secondary | ICD-10-CM | POA: Diagnosis not present

## 2016-03-30 DIAGNOSIS — Z01818 Encounter for other preprocedural examination: Secondary | ICD-10-CM | POA: Diagnosis not present

## 2016-03-30 DIAGNOSIS — Z96642 Presence of left artificial hip joint: Secondary | ICD-10-CM | POA: Diagnosis not present

## 2016-03-30 DIAGNOSIS — Z01810 Encounter for preprocedural cardiovascular examination: Secondary | ICD-10-CM | POA: Diagnosis not present

## 2016-04-04 DIAGNOSIS — Z961 Presence of intraocular lens: Secondary | ICD-10-CM | POA: Diagnosis not present

## 2016-04-18 DIAGNOSIS — M1612 Unilateral primary osteoarthritis, left hip: Secondary | ICD-10-CM | POA: Diagnosis not present

## 2016-04-27 DIAGNOSIS — I959 Hypotension, unspecified: Secondary | ICD-10-CM | POA: Diagnosis not present

## 2016-04-27 DIAGNOSIS — Z8639 Personal history of other endocrine, nutritional and metabolic disease: Secondary | ICD-10-CM | POA: Diagnosis not present

## 2016-04-27 DIAGNOSIS — Z79899 Other long term (current) drug therapy: Secondary | ICD-10-CM | POA: Diagnosis not present

## 2016-04-27 DIAGNOSIS — Z87891 Personal history of nicotine dependence: Secondary | ICD-10-CM | POA: Diagnosis not present

## 2016-04-27 DIAGNOSIS — R1110 Vomiting, unspecified: Secondary | ICD-10-CM | POA: Diagnosis not present

## 2016-04-27 DIAGNOSIS — E785 Hyperlipidemia, unspecified: Secondary | ICD-10-CM | POA: Diagnosis present

## 2016-04-27 DIAGNOSIS — I9581 Postprocedural hypotension: Secondary | ICD-10-CM | POA: Diagnosis not present

## 2016-04-27 DIAGNOSIS — D500 Iron deficiency anemia secondary to blood loss (chronic): Secondary | ICD-10-CM | POA: Diagnosis present

## 2016-04-27 DIAGNOSIS — Z862 Personal history of diseases of the blood and blood-forming organs and certain disorders involving the immune mechanism: Secondary | ICD-10-CM | POA: Diagnosis not present

## 2016-04-27 DIAGNOSIS — Z888 Allergy status to other drugs, medicaments and biological substances status: Secondary | ICD-10-CM | POA: Diagnosis not present

## 2016-04-27 DIAGNOSIS — Z471 Aftercare following joint replacement surgery: Secondary | ICD-10-CM | POA: Diagnosis not present

## 2016-04-27 DIAGNOSIS — J449 Chronic obstructive pulmonary disease, unspecified: Secondary | ICD-10-CM | POA: Diagnosis not present

## 2016-04-27 DIAGNOSIS — R110 Nausea: Secondary | ICD-10-CM | POA: Diagnosis not present

## 2016-04-27 DIAGNOSIS — M25559 Pain in unspecified hip: Secondary | ICD-10-CM | POA: Diagnosis not present

## 2016-04-27 DIAGNOSIS — E7800 Pure hypercholesterolemia, unspecified: Secondary | ICD-10-CM | POA: Diagnosis present

## 2016-04-27 DIAGNOSIS — R509 Fever, unspecified: Secondary | ICD-10-CM | POA: Diagnosis not present

## 2016-04-27 DIAGNOSIS — R5082 Postprocedural fever: Secondary | ICD-10-CM | POA: Diagnosis not present

## 2016-04-27 DIAGNOSIS — E039 Hypothyroidism, unspecified: Secondary | ICD-10-CM | POA: Diagnosis present

## 2016-04-27 DIAGNOSIS — D509 Iron deficiency anemia, unspecified: Secondary | ICD-10-CM | POA: Diagnosis not present

## 2016-04-27 DIAGNOSIS — M1612 Unilateral primary osteoarthritis, left hip: Secondary | ICD-10-CM | POA: Diagnosis not present

## 2016-04-27 DIAGNOSIS — Z96642 Presence of left artificial hip joint: Secondary | ICD-10-CM | POA: Diagnosis not present

## 2016-04-27 DIAGNOSIS — I10 Essential (primary) hypertension: Secondary | ICD-10-CM | POA: Diagnosis present

## 2016-04-27 DIAGNOSIS — Z880 Allergy status to penicillin: Secondary | ICD-10-CM | POA: Diagnosis not present

## 2016-04-28 DIAGNOSIS — D509 Iron deficiency anemia, unspecified: Secondary | ICD-10-CM

## 2016-04-28 DIAGNOSIS — I959 Hypotension, unspecified: Secondary | ICD-10-CM

## 2016-04-29 DIAGNOSIS — R509 Fever, unspecified: Secondary | ICD-10-CM

## 2016-04-29 DIAGNOSIS — R110 Nausea: Secondary | ICD-10-CM

## 2016-04-30 DIAGNOSIS — M25559 Pain in unspecified hip: Secondary | ICD-10-CM | POA: Diagnosis not present

## 2016-04-30 DIAGNOSIS — Z862 Personal history of diseases of the blood and blood-forming organs and certain disorders involving the immune mechanism: Secondary | ICD-10-CM | POA: Diagnosis not present

## 2016-04-30 DIAGNOSIS — I959 Hypotension, unspecified: Secondary | ICD-10-CM | POA: Diagnosis not present

## 2016-04-30 DIAGNOSIS — Z96642 Presence of left artificial hip joint: Secondary | ICD-10-CM | POA: Diagnosis not present

## 2016-04-30 DIAGNOSIS — D509 Iron deficiency anemia, unspecified: Secondary | ICD-10-CM | POA: Diagnosis not present

## 2016-04-30 DIAGNOSIS — Z8639 Personal history of other endocrine, nutritional and metabolic disease: Secondary | ICD-10-CM | POA: Diagnosis not present

## 2016-04-30 DIAGNOSIS — Q2730 Arteriovenous malformation, site unspecified: Secondary | ICD-10-CM | POA: Diagnosis not present

## 2016-04-30 DIAGNOSIS — M1612 Unilateral primary osteoarthritis, left hip: Secondary | ICD-10-CM | POA: Diagnosis not present

## 2016-04-30 DIAGNOSIS — R110 Nausea: Secondary | ICD-10-CM | POA: Diagnosis not present

## 2016-04-30 DIAGNOSIS — Z471 Aftercare following joint replacement surgery: Secondary | ICD-10-CM | POA: Diagnosis not present

## 2016-04-30 DIAGNOSIS — R509 Fever, unspecified: Secondary | ICD-10-CM | POA: Diagnosis not present

## 2016-05-03 DIAGNOSIS — Q2730 Arteriovenous malformation, site unspecified: Secondary | ICD-10-CM | POA: Diagnosis not present

## 2016-05-10 DIAGNOSIS — Z96642 Presence of left artificial hip joint: Secondary | ICD-10-CM | POA: Diagnosis not present

## 2016-05-13 DIAGNOSIS — Z471 Aftercare following joint replacement surgery: Secondary | ICD-10-CM | POA: Diagnosis not present

## 2016-05-13 DIAGNOSIS — Z79891 Long term (current) use of opiate analgesic: Secondary | ICD-10-CM | POA: Diagnosis not present

## 2016-05-13 DIAGNOSIS — Z9181 History of falling: Secondary | ICD-10-CM | POA: Diagnosis not present

## 2016-05-13 DIAGNOSIS — Z602 Problems related to living alone: Secondary | ICD-10-CM | POA: Diagnosis not present

## 2016-05-13 DIAGNOSIS — I10 Essential (primary) hypertension: Secondary | ICD-10-CM | POA: Diagnosis not present

## 2016-05-13 DIAGNOSIS — Z96642 Presence of left artificial hip joint: Secondary | ICD-10-CM | POA: Diagnosis not present

## 2016-05-13 DIAGNOSIS — I959 Hypotension, unspecified: Secondary | ICD-10-CM | POA: Diagnosis not present

## 2016-05-18 DIAGNOSIS — I10 Essential (primary) hypertension: Secondary | ICD-10-CM | POA: Diagnosis not present

## 2016-05-18 DIAGNOSIS — I959 Hypotension, unspecified: Secondary | ICD-10-CM | POA: Diagnosis not present

## 2016-05-18 DIAGNOSIS — Z471 Aftercare following joint replacement surgery: Secondary | ICD-10-CM | POA: Diagnosis not present

## 2016-05-18 DIAGNOSIS — Z79891 Long term (current) use of opiate analgesic: Secondary | ICD-10-CM | POA: Diagnosis not present

## 2016-05-18 DIAGNOSIS — Z9181 History of falling: Secondary | ICD-10-CM | POA: Diagnosis not present

## 2016-05-18 DIAGNOSIS — Z96642 Presence of left artificial hip joint: Secondary | ICD-10-CM | POA: Diagnosis not present

## 2016-05-19 DIAGNOSIS — I959 Hypotension, unspecified: Secondary | ICD-10-CM | POA: Diagnosis not present

## 2016-05-19 DIAGNOSIS — I10 Essential (primary) hypertension: Secondary | ICD-10-CM | POA: Diagnosis not present

## 2016-05-19 DIAGNOSIS — Z96642 Presence of left artificial hip joint: Secondary | ICD-10-CM | POA: Diagnosis not present

## 2016-05-19 DIAGNOSIS — Z9181 History of falling: Secondary | ICD-10-CM | POA: Diagnosis not present

## 2016-05-19 DIAGNOSIS — Z79891 Long term (current) use of opiate analgesic: Secondary | ICD-10-CM | POA: Diagnosis not present

## 2016-05-19 DIAGNOSIS — Z471 Aftercare following joint replacement surgery: Secondary | ICD-10-CM | POA: Diagnosis not present

## 2016-05-20 DIAGNOSIS — Z79891 Long term (current) use of opiate analgesic: Secondary | ICD-10-CM | POA: Diagnosis not present

## 2016-05-20 DIAGNOSIS — I10 Essential (primary) hypertension: Secondary | ICD-10-CM | POA: Diagnosis not present

## 2016-05-20 DIAGNOSIS — Z96642 Presence of left artificial hip joint: Secondary | ICD-10-CM | POA: Diagnosis not present

## 2016-05-20 DIAGNOSIS — Z471 Aftercare following joint replacement surgery: Secondary | ICD-10-CM | POA: Diagnosis not present

## 2016-05-20 DIAGNOSIS — I959 Hypotension, unspecified: Secondary | ICD-10-CM | POA: Diagnosis not present

## 2016-05-20 DIAGNOSIS — Z9181 History of falling: Secondary | ICD-10-CM | POA: Diagnosis not present

## 2016-05-23 DIAGNOSIS — Z79891 Long term (current) use of opiate analgesic: Secondary | ICD-10-CM | POA: Diagnosis not present

## 2016-05-23 DIAGNOSIS — Z9181 History of falling: Secondary | ICD-10-CM | POA: Diagnosis not present

## 2016-05-23 DIAGNOSIS — Z96642 Presence of left artificial hip joint: Secondary | ICD-10-CM | POA: Diagnosis not present

## 2016-05-23 DIAGNOSIS — I959 Hypotension, unspecified: Secondary | ICD-10-CM | POA: Diagnosis not present

## 2016-05-23 DIAGNOSIS — I10 Essential (primary) hypertension: Secondary | ICD-10-CM | POA: Diagnosis not present

## 2016-05-23 DIAGNOSIS — Z471 Aftercare following joint replacement surgery: Secondary | ICD-10-CM | POA: Diagnosis not present

## 2016-05-24 DIAGNOSIS — D649 Anemia, unspecified: Secondary | ICD-10-CM | POA: Diagnosis not present

## 2016-05-24 DIAGNOSIS — E782 Mixed hyperlipidemia: Secondary | ICD-10-CM | POA: Diagnosis not present

## 2016-05-24 DIAGNOSIS — I959 Hypotension, unspecified: Secondary | ICD-10-CM | POA: Diagnosis not present

## 2016-05-24 DIAGNOSIS — Z9181 History of falling: Secondary | ICD-10-CM | POA: Diagnosis not present

## 2016-05-24 DIAGNOSIS — E039 Hypothyroidism, unspecified: Secondary | ICD-10-CM | POA: Diagnosis not present

## 2016-05-24 DIAGNOSIS — Z471 Aftercare following joint replacement surgery: Secondary | ICD-10-CM | POA: Diagnosis not present

## 2016-05-24 DIAGNOSIS — Z96642 Presence of left artificial hip joint: Secondary | ICD-10-CM | POA: Diagnosis not present

## 2016-05-24 DIAGNOSIS — I10 Essential (primary) hypertension: Secondary | ICD-10-CM | POA: Diagnosis not present

## 2016-05-24 DIAGNOSIS — Z1389 Encounter for screening for other disorder: Secondary | ICD-10-CM | POA: Diagnosis not present

## 2016-05-24 DIAGNOSIS — Z79891 Long term (current) use of opiate analgesic: Secondary | ICD-10-CM | POA: Diagnosis not present

## 2016-05-25 DIAGNOSIS — I959 Hypotension, unspecified: Secondary | ICD-10-CM | POA: Diagnosis not present

## 2016-05-25 DIAGNOSIS — M7122 Synovial cyst of popliteal space [Baker], left knee: Secondary | ICD-10-CM | POA: Diagnosis not present

## 2016-05-25 DIAGNOSIS — Z9181 History of falling: Secondary | ICD-10-CM | POA: Diagnosis not present

## 2016-05-25 DIAGNOSIS — M7989 Other specified soft tissue disorders: Secondary | ICD-10-CM | POA: Diagnosis not present

## 2016-05-25 DIAGNOSIS — Z79891 Long term (current) use of opiate analgesic: Secondary | ICD-10-CM | POA: Diagnosis not present

## 2016-05-25 DIAGNOSIS — Z471 Aftercare following joint replacement surgery: Secondary | ICD-10-CM | POA: Diagnosis not present

## 2016-05-25 DIAGNOSIS — Z96642 Presence of left artificial hip joint: Secondary | ICD-10-CM | POA: Diagnosis not present

## 2016-05-25 DIAGNOSIS — I10 Essential (primary) hypertension: Secondary | ICD-10-CM | POA: Diagnosis not present

## 2016-05-25 DIAGNOSIS — R600 Localized edema: Secondary | ICD-10-CM | POA: Diagnosis not present

## 2016-05-27 DIAGNOSIS — Z9181 History of falling: Secondary | ICD-10-CM | POA: Diagnosis not present

## 2016-05-27 DIAGNOSIS — Z471 Aftercare following joint replacement surgery: Secondary | ICD-10-CM | POA: Diagnosis not present

## 2016-05-27 DIAGNOSIS — Z79891 Long term (current) use of opiate analgesic: Secondary | ICD-10-CM | POA: Diagnosis not present

## 2016-05-27 DIAGNOSIS — I959 Hypotension, unspecified: Secondary | ICD-10-CM | POA: Diagnosis not present

## 2016-05-27 DIAGNOSIS — Z96642 Presence of left artificial hip joint: Secondary | ICD-10-CM | POA: Diagnosis not present

## 2016-05-27 DIAGNOSIS — I10 Essential (primary) hypertension: Secondary | ICD-10-CM | POA: Diagnosis not present

## 2016-05-30 DIAGNOSIS — I10 Essential (primary) hypertension: Secondary | ICD-10-CM | POA: Diagnosis not present

## 2016-05-30 DIAGNOSIS — Z9181 History of falling: Secondary | ICD-10-CM | POA: Diagnosis not present

## 2016-05-30 DIAGNOSIS — Z96642 Presence of left artificial hip joint: Secondary | ICD-10-CM | POA: Diagnosis not present

## 2016-05-30 DIAGNOSIS — Z79891 Long term (current) use of opiate analgesic: Secondary | ICD-10-CM | POA: Diagnosis not present

## 2016-05-30 DIAGNOSIS — Z471 Aftercare following joint replacement surgery: Secondary | ICD-10-CM | POA: Diagnosis not present

## 2016-05-30 DIAGNOSIS — I959 Hypotension, unspecified: Secondary | ICD-10-CM | POA: Diagnosis not present

## 2016-06-01 DIAGNOSIS — Z471 Aftercare following joint replacement surgery: Secondary | ICD-10-CM | POA: Diagnosis not present

## 2016-06-01 DIAGNOSIS — Z9181 History of falling: Secondary | ICD-10-CM | POA: Diagnosis not present

## 2016-06-01 DIAGNOSIS — Z79891 Long term (current) use of opiate analgesic: Secondary | ICD-10-CM | POA: Diagnosis not present

## 2016-06-01 DIAGNOSIS — I959 Hypotension, unspecified: Secondary | ICD-10-CM | POA: Diagnosis not present

## 2016-06-01 DIAGNOSIS — I10 Essential (primary) hypertension: Secondary | ICD-10-CM | POA: Diagnosis not present

## 2016-06-01 DIAGNOSIS — Z96642 Presence of left artificial hip joint: Secondary | ICD-10-CM | POA: Diagnosis not present

## 2016-06-03 DIAGNOSIS — I10 Essential (primary) hypertension: Secondary | ICD-10-CM | POA: Diagnosis not present

## 2016-06-03 DIAGNOSIS — Z79891 Long term (current) use of opiate analgesic: Secondary | ICD-10-CM | POA: Diagnosis not present

## 2016-06-03 DIAGNOSIS — I959 Hypotension, unspecified: Secondary | ICD-10-CM | POA: Diagnosis not present

## 2016-06-03 DIAGNOSIS — Z96642 Presence of left artificial hip joint: Secondary | ICD-10-CM | POA: Diagnosis not present

## 2016-06-03 DIAGNOSIS — Z471 Aftercare following joint replacement surgery: Secondary | ICD-10-CM | POA: Diagnosis not present

## 2016-06-03 DIAGNOSIS — Z9181 History of falling: Secondary | ICD-10-CM | POA: Diagnosis not present

## 2016-06-06 DIAGNOSIS — I959 Hypotension, unspecified: Secondary | ICD-10-CM | POA: Diagnosis not present

## 2016-06-06 DIAGNOSIS — Z471 Aftercare following joint replacement surgery: Secondary | ICD-10-CM | POA: Diagnosis not present

## 2016-06-06 DIAGNOSIS — I10 Essential (primary) hypertension: Secondary | ICD-10-CM | POA: Diagnosis not present

## 2016-06-06 DIAGNOSIS — Z79891 Long term (current) use of opiate analgesic: Secondary | ICD-10-CM | POA: Diagnosis not present

## 2016-06-06 DIAGNOSIS — Z96642 Presence of left artificial hip joint: Secondary | ICD-10-CM | POA: Diagnosis not present

## 2016-06-06 DIAGNOSIS — Z9181 History of falling: Secondary | ICD-10-CM | POA: Diagnosis not present

## 2016-06-07 DIAGNOSIS — Z96642 Presence of left artificial hip joint: Secondary | ICD-10-CM | POA: Diagnosis not present

## 2016-06-10 DIAGNOSIS — I959 Hypotension, unspecified: Secondary | ICD-10-CM | POA: Diagnosis not present

## 2016-06-10 DIAGNOSIS — Z471 Aftercare following joint replacement surgery: Secondary | ICD-10-CM | POA: Diagnosis not present

## 2016-06-10 DIAGNOSIS — Z9181 History of falling: Secondary | ICD-10-CM | POA: Diagnosis not present

## 2016-06-10 DIAGNOSIS — Z96642 Presence of left artificial hip joint: Secondary | ICD-10-CM | POA: Diagnosis not present

## 2016-06-10 DIAGNOSIS — Z79891 Long term (current) use of opiate analgesic: Secondary | ICD-10-CM | POA: Diagnosis not present

## 2016-06-10 DIAGNOSIS — I10 Essential (primary) hypertension: Secondary | ICD-10-CM | POA: Diagnosis not present

## 2016-06-13 DIAGNOSIS — Z471 Aftercare following joint replacement surgery: Secondary | ICD-10-CM | POA: Diagnosis not present

## 2016-06-13 DIAGNOSIS — Z96642 Presence of left artificial hip joint: Secondary | ICD-10-CM | POA: Diagnosis not present

## 2016-06-13 DIAGNOSIS — I959 Hypotension, unspecified: Secondary | ICD-10-CM | POA: Diagnosis not present

## 2016-06-13 DIAGNOSIS — Z79891 Long term (current) use of opiate analgesic: Secondary | ICD-10-CM | POA: Diagnosis not present

## 2016-06-13 DIAGNOSIS — I10 Essential (primary) hypertension: Secondary | ICD-10-CM | POA: Diagnosis not present

## 2016-06-13 DIAGNOSIS — Z9181 History of falling: Secondary | ICD-10-CM | POA: Diagnosis not present

## 2016-06-15 DIAGNOSIS — Z471 Aftercare following joint replacement surgery: Secondary | ICD-10-CM | POA: Diagnosis not present

## 2016-06-15 DIAGNOSIS — I959 Hypotension, unspecified: Secondary | ICD-10-CM | POA: Diagnosis not present

## 2016-06-15 DIAGNOSIS — Z9181 History of falling: Secondary | ICD-10-CM | POA: Diagnosis not present

## 2016-06-15 DIAGNOSIS — Z96642 Presence of left artificial hip joint: Secondary | ICD-10-CM | POA: Diagnosis not present

## 2016-06-15 DIAGNOSIS — Z79891 Long term (current) use of opiate analgesic: Secondary | ICD-10-CM | POA: Diagnosis not present

## 2016-06-15 DIAGNOSIS — I10 Essential (primary) hypertension: Secondary | ICD-10-CM | POA: Diagnosis not present

## 2016-07-19 DIAGNOSIS — Z96642 Presence of left artificial hip joint: Secondary | ICD-10-CM | POA: Diagnosis not present

## 2016-07-19 DIAGNOSIS — D649 Anemia, unspecified: Secondary | ICD-10-CM | POA: Diagnosis not present

## 2016-07-20 DIAGNOSIS — D509 Iron deficiency anemia, unspecified: Secondary | ICD-10-CM | POA: Diagnosis not present

## 2016-09-27 DIAGNOSIS — R5383 Other fatigue: Secondary | ICD-10-CM | POA: Diagnosis not present

## 2016-09-27 DIAGNOSIS — E559 Vitamin D deficiency, unspecified: Secondary | ICD-10-CM | POA: Diagnosis not present

## 2016-09-27 DIAGNOSIS — Z79899 Other long term (current) drug therapy: Secondary | ICD-10-CM | POA: Diagnosis not present

## 2016-09-27 DIAGNOSIS — Z1389 Encounter for screening for other disorder: Secondary | ICD-10-CM | POA: Diagnosis not present

## 2016-09-27 DIAGNOSIS — Z23 Encounter for immunization: Secondary | ICD-10-CM | POA: Diagnosis not present

## 2016-09-27 DIAGNOSIS — I10 Essential (primary) hypertension: Secondary | ICD-10-CM | POA: Diagnosis not present

## 2016-09-27 DIAGNOSIS — E782 Mixed hyperlipidemia: Secondary | ICD-10-CM | POA: Diagnosis not present

## 2016-09-30 DIAGNOSIS — R911 Solitary pulmonary nodule: Secondary | ICD-10-CM | POA: Diagnosis not present

## 2016-09-30 DIAGNOSIS — E010 Iodine-deficiency related diffuse (endemic) goiter: Secondary | ICD-10-CM | POA: Diagnosis not present

## 2016-10-18 DIAGNOSIS — Z96649 Presence of unspecified artificial hip joint: Secondary | ICD-10-CM | POA: Diagnosis not present

## 2017-01-16 DIAGNOSIS — D500 Iron deficiency anemia secondary to blood loss (chronic): Secondary | ICD-10-CM | POA: Diagnosis not present

## 2017-01-17 DIAGNOSIS — D509 Iron deficiency anemia, unspecified: Secondary | ICD-10-CM | POA: Diagnosis not present

## 2017-01-17 DIAGNOSIS — Z862 Personal history of diseases of the blood and blood-forming organs and certain disorders involving the immune mechanism: Secondary | ICD-10-CM | POA: Diagnosis not present

## 2017-02-23 DIAGNOSIS — K5520 Angiodysplasia of colon without hemorrhage: Secondary | ICD-10-CM | POA: Diagnosis not present

## 2017-03-30 DIAGNOSIS — Z9181 History of falling: Secondary | ICD-10-CM | POA: Diagnosis not present

## 2017-03-30 DIAGNOSIS — Z1389 Encounter for screening for other disorder: Secondary | ICD-10-CM | POA: Diagnosis not present

## 2017-03-30 DIAGNOSIS — Z0000 Encounter for general adult medical examination without abnormal findings: Secondary | ICD-10-CM | POA: Diagnosis not present

## 2017-03-30 DIAGNOSIS — Z6833 Body mass index (BMI) 33.0-33.9, adult: Secondary | ICD-10-CM | POA: Diagnosis not present

## 2017-03-30 DIAGNOSIS — E782 Mixed hyperlipidemia: Secondary | ICD-10-CM | POA: Diagnosis not present

## 2017-03-30 DIAGNOSIS — Z79899 Other long term (current) drug therapy: Secondary | ICD-10-CM | POA: Diagnosis not present

## 2017-03-30 DIAGNOSIS — I10 Essential (primary) hypertension: Secondary | ICD-10-CM | POA: Diagnosis not present

## 2017-04-11 DIAGNOSIS — Z1231 Encounter for screening mammogram for malignant neoplasm of breast: Secondary | ICD-10-CM | POA: Diagnosis not present

## 2017-04-11 DIAGNOSIS — Z803 Family history of malignant neoplasm of breast: Secondary | ICD-10-CM | POA: Diagnosis not present

## 2017-04-18 DIAGNOSIS — Z96649 Presence of unspecified artificial hip joint: Secondary | ICD-10-CM | POA: Diagnosis not present

## 2017-04-18 DIAGNOSIS — M1612 Unilateral primary osteoarthritis, left hip: Secondary | ICD-10-CM | POA: Diagnosis not present

## 2017-04-27 DIAGNOSIS — M4726 Other spondylosis with radiculopathy, lumbar region: Secondary | ICD-10-CM | POA: Diagnosis not present

## 2017-04-27 DIAGNOSIS — I10 Essential (primary) hypertension: Secondary | ICD-10-CM | POA: Diagnosis not present

## 2017-05-08 DIAGNOSIS — M5136 Other intervertebral disc degeneration, lumbar region: Secondary | ICD-10-CM | POA: Diagnosis not present

## 2017-05-08 DIAGNOSIS — M5416 Radiculopathy, lumbar region: Secondary | ICD-10-CM | POA: Diagnosis not present

## 2017-05-08 DIAGNOSIS — M4726 Other spondylosis with radiculopathy, lumbar region: Secondary | ICD-10-CM | POA: Diagnosis not present

## 2017-05-08 DIAGNOSIS — M48061 Spinal stenosis, lumbar region without neurogenic claudication: Secondary | ICD-10-CM | POA: Diagnosis not present

## 2017-06-19 DIAGNOSIS — I10 Essential (primary) hypertension: Secondary | ICD-10-CM | POA: Diagnosis not present

## 2017-06-19 DIAGNOSIS — Z6832 Body mass index (BMI) 32.0-32.9, adult: Secondary | ICD-10-CM | POA: Diagnosis not present

## 2017-06-19 DIAGNOSIS — I803 Phlebitis and thrombophlebitis of lower extremities, unspecified: Secondary | ICD-10-CM | POA: Diagnosis not present

## 2017-07-19 DIAGNOSIS — D500 Iron deficiency anemia secondary to blood loss (chronic): Secondary | ICD-10-CM | POA: Diagnosis not present

## 2017-07-20 DIAGNOSIS — D500 Iron deficiency anemia secondary to blood loss (chronic): Secondary | ICD-10-CM | POA: Diagnosis not present

## 2017-07-20 DIAGNOSIS — Z8672 Personal history of thrombophlebitis: Secondary | ICD-10-CM | POA: Diagnosis not present

## 2017-11-30 DIAGNOSIS — Z6833 Body mass index (BMI) 33.0-33.9, adult: Secondary | ICD-10-CM | POA: Diagnosis not present

## 2017-11-30 DIAGNOSIS — I10 Essential (primary) hypertension: Secondary | ICD-10-CM | POA: Diagnosis not present

## 2017-11-30 DIAGNOSIS — M4726 Other spondylosis with radiculopathy, lumbar region: Secondary | ICD-10-CM | POA: Diagnosis not present

## 2017-12-04 DIAGNOSIS — M545 Low back pain: Secondary | ICD-10-CM | POA: Diagnosis not present

## 2017-12-04 DIAGNOSIS — M4726 Other spondylosis with radiculopathy, lumbar region: Secondary | ICD-10-CM | POA: Diagnosis not present

## 2017-12-04 DIAGNOSIS — M5126 Other intervertebral disc displacement, lumbar region: Secondary | ICD-10-CM | POA: Diagnosis not present

## 2018-01-01 DIAGNOSIS — M4726 Other spondylosis with radiculopathy, lumbar region: Secondary | ICD-10-CM | POA: Diagnosis not present

## 2018-01-01 DIAGNOSIS — M5416 Radiculopathy, lumbar region: Secondary | ICD-10-CM | POA: Diagnosis not present

## 2018-01-01 DIAGNOSIS — M48061 Spinal stenosis, lumbar region without neurogenic claudication: Secondary | ICD-10-CM | POA: Diagnosis not present

## 2018-01-01 DIAGNOSIS — M5136 Other intervertebral disc degeneration, lumbar region: Secondary | ICD-10-CM | POA: Diagnosis not present

## 2018-01-09 DIAGNOSIS — E782 Mixed hyperlipidemia: Secondary | ICD-10-CM | POA: Diagnosis not present

## 2018-01-09 DIAGNOSIS — Z79899 Other long term (current) drug therapy: Secondary | ICD-10-CM | POA: Diagnosis not present

## 2018-01-09 DIAGNOSIS — Z6833 Body mass index (BMI) 33.0-33.9, adult: Secondary | ICD-10-CM | POA: Diagnosis not present

## 2018-01-09 DIAGNOSIS — D510 Vitamin B12 deficiency anemia due to intrinsic factor deficiency: Secondary | ICD-10-CM | POA: Diagnosis not present

## 2018-01-09 DIAGNOSIS — I10 Essential (primary) hypertension: Secondary | ICD-10-CM | POA: Diagnosis not present

## 2018-01-09 DIAGNOSIS — Z23 Encounter for immunization: Secondary | ICD-10-CM | POA: Diagnosis not present

## 2018-01-11 DIAGNOSIS — Z6833 Body mass index (BMI) 33.0-33.9, adult: Secondary | ICD-10-CM | POA: Diagnosis not present

## 2018-01-11 DIAGNOSIS — I10 Essential (primary) hypertension: Secondary | ICD-10-CM | POA: Diagnosis not present

## 2018-01-11 DIAGNOSIS — M47816 Spondylosis without myelopathy or radiculopathy, lumbar region: Secondary | ICD-10-CM | POA: Diagnosis not present

## 2018-02-01 DIAGNOSIS — M47816 Spondylosis without myelopathy or radiculopathy, lumbar region: Secondary | ICD-10-CM | POA: Diagnosis not present

## 2018-03-07 DIAGNOSIS — I10 Essential (primary) hypertension: Secondary | ICD-10-CM | POA: Diagnosis not present

## 2018-03-07 DIAGNOSIS — Z6834 Body mass index (BMI) 34.0-34.9, adult: Secondary | ICD-10-CM | POA: Diagnosis not present

## 2018-03-07 DIAGNOSIS — M47816 Spondylosis without myelopathy or radiculopathy, lumbar region: Secondary | ICD-10-CM | POA: Diagnosis not present

## 2018-04-26 DIAGNOSIS — M859 Disorder of bone density and structure, unspecified: Secondary | ICD-10-CM | POA: Diagnosis not present

## 2018-04-26 DIAGNOSIS — Z9181 History of falling: Secondary | ICD-10-CM | POA: Diagnosis not present

## 2018-04-26 DIAGNOSIS — Z0000 Encounter for general adult medical examination without abnormal findings: Secondary | ICD-10-CM | POA: Diagnosis not present

## 2018-04-26 DIAGNOSIS — Z6833 Body mass index (BMI) 33.0-33.9, adult: Secondary | ICD-10-CM | POA: Diagnosis not present

## 2018-04-26 DIAGNOSIS — Z13820 Encounter for screening for osteoporosis: Secondary | ICD-10-CM | POA: Diagnosis not present

## 2018-04-26 DIAGNOSIS — M8580 Other specified disorders of bone density and structure, unspecified site: Secondary | ICD-10-CM | POA: Diagnosis not present

## 2018-04-26 DIAGNOSIS — Z1331 Encounter for screening for depression: Secondary | ICD-10-CM | POA: Diagnosis not present

## 2018-04-26 DIAGNOSIS — I10 Essential (primary) hypertension: Secondary | ICD-10-CM | POA: Diagnosis not present

## 2018-05-07 DIAGNOSIS — I6523 Occlusion and stenosis of bilateral carotid arteries: Secondary | ICD-10-CM | POA: Diagnosis not present

## 2018-05-07 DIAGNOSIS — R0989 Other specified symptoms and signs involving the circulatory and respiratory systems: Secondary | ICD-10-CM | POA: Diagnosis not present

## 2018-06-15 DIAGNOSIS — R51 Headache: Secondary | ICD-10-CM | POA: Diagnosis not present

## 2018-06-15 DIAGNOSIS — R079 Chest pain, unspecified: Secondary | ICD-10-CM | POA: Diagnosis not present

## 2018-06-15 DIAGNOSIS — G4489 Other headache syndrome: Secondary | ICD-10-CM | POA: Diagnosis not present

## 2018-06-15 DIAGNOSIS — G43909 Migraine, unspecified, not intractable, without status migrainosus: Secondary | ICD-10-CM | POA: Diagnosis not present

## 2018-06-15 DIAGNOSIS — R42 Dizziness and giddiness: Secondary | ICD-10-CM | POA: Diagnosis not present

## 2018-06-18 DIAGNOSIS — E782 Mixed hyperlipidemia: Secondary | ICD-10-CM | POA: Diagnosis not present

## 2018-06-18 DIAGNOSIS — I10 Essential (primary) hypertension: Secondary | ICD-10-CM | POA: Diagnosis not present

## 2018-06-18 DIAGNOSIS — Z6832 Body mass index (BMI) 32.0-32.9, adult: Secondary | ICD-10-CM | POA: Diagnosis not present

## 2018-06-18 DIAGNOSIS — Z1331 Encounter for screening for depression: Secondary | ICD-10-CM | POA: Diagnosis not present

## 2018-07-20 DIAGNOSIS — K921 Melena: Secondary | ICD-10-CM | POA: Diagnosis not present

## 2018-07-20 DIAGNOSIS — D509 Iron deficiency anemia, unspecified: Secondary | ICD-10-CM | POA: Diagnosis not present

## 2018-07-27 DIAGNOSIS — D500 Iron deficiency anemia secondary to blood loss (chronic): Secondary | ICD-10-CM | POA: Diagnosis not present

## 2018-08-03 DIAGNOSIS — D500 Iron deficiency anemia secondary to blood loss (chronic): Secondary | ICD-10-CM | POA: Diagnosis not present

## 2018-08-08 DIAGNOSIS — D649 Anemia, unspecified: Secondary | ICD-10-CM | POA: Diagnosis not present

## 2018-08-08 DIAGNOSIS — K648 Other hemorrhoids: Secondary | ICD-10-CM | POA: Diagnosis not present

## 2018-08-08 DIAGNOSIS — D509 Iron deficiency anemia, unspecified: Secondary | ICD-10-CM | POA: Diagnosis not present

## 2018-08-08 DIAGNOSIS — K5730 Diverticulosis of large intestine without perforation or abscess without bleeding: Secondary | ICD-10-CM | POA: Diagnosis not present

## 2018-08-14 DIAGNOSIS — K449 Diaphragmatic hernia without obstruction or gangrene: Secondary | ICD-10-CM | POA: Diagnosis not present

## 2018-08-14 DIAGNOSIS — E7800 Pure hypercholesterolemia, unspecified: Secondary | ICD-10-CM | POA: Diagnosis not present

## 2018-08-14 DIAGNOSIS — K31811 Angiodysplasia of stomach and duodenum with bleeding: Secondary | ICD-10-CM | POA: Diagnosis not present

## 2018-08-14 DIAGNOSIS — K2940 Chronic atrophic gastritis without bleeding: Secondary | ICD-10-CM | POA: Diagnosis not present

## 2018-08-14 DIAGNOSIS — K2941 Chronic atrophic gastritis with bleeding: Secondary | ICD-10-CM | POA: Diagnosis not present

## 2018-08-14 DIAGNOSIS — Q2733 Arteriovenous malformation of digestive system vessel: Secondary | ICD-10-CM | POA: Diagnosis not present

## 2018-08-14 DIAGNOSIS — E079 Disorder of thyroid, unspecified: Secondary | ICD-10-CM | POA: Diagnosis not present

## 2018-08-14 DIAGNOSIS — D509 Iron deficiency anemia, unspecified: Secondary | ICD-10-CM | POA: Diagnosis not present

## 2018-08-14 DIAGNOSIS — I10 Essential (primary) hypertension: Secondary | ICD-10-CM | POA: Diagnosis not present

## 2018-08-14 DIAGNOSIS — Z79899 Other long term (current) drug therapy: Secondary | ICD-10-CM | POA: Diagnosis not present

## 2018-08-14 DIAGNOSIS — K31819 Angiodysplasia of stomach and duodenum without bleeding: Secondary | ICD-10-CM | POA: Diagnosis not present

## 2018-08-22 DIAGNOSIS — D649 Anemia, unspecified: Secondary | ICD-10-CM | POA: Diagnosis not present

## 2018-09-21 DIAGNOSIS — D649 Anemia, unspecified: Secondary | ICD-10-CM | POA: Diagnosis not present

## 2018-10-03 ENCOUNTER — Other Ambulatory Visit: Payer: Self-pay

## 2018-10-18 DIAGNOSIS — D508 Other iron deficiency anemias: Secondary | ICD-10-CM

## 2018-10-18 DIAGNOSIS — Q2733 Arteriovenous malformation of digestive system vessel: Secondary | ICD-10-CM | POA: Diagnosis not present

## 2018-10-18 DIAGNOSIS — D500 Iron deficiency anemia secondary to blood loss (chronic): Secondary | ICD-10-CM | POA: Diagnosis not present

## 2018-10-18 DIAGNOSIS — K5521 Angiodysplasia of colon with hemorrhage: Secondary | ICD-10-CM

## 2018-10-22 DIAGNOSIS — K5730 Diverticulosis of large intestine without perforation or abscess without bleeding: Secondary | ICD-10-CM | POA: Diagnosis not present

## 2018-10-22 DIAGNOSIS — D509 Iron deficiency anemia, unspecified: Secondary | ICD-10-CM | POA: Diagnosis not present

## 2018-10-22 DIAGNOSIS — Q2733 Arteriovenous malformation of digestive system vessel: Secondary | ICD-10-CM | POA: Diagnosis not present

## 2018-10-22 DIAGNOSIS — K648 Other hemorrhoids: Secondary | ICD-10-CM | POA: Diagnosis not present

## 2018-11-26 DIAGNOSIS — K921 Melena: Secondary | ICD-10-CM | POA: Diagnosis not present

## 2018-11-26 DIAGNOSIS — K5730 Diverticulosis of large intestine without perforation or abscess without bleeding: Secondary | ICD-10-CM | POA: Diagnosis not present

## 2018-11-26 DIAGNOSIS — K648 Other hemorrhoids: Secondary | ICD-10-CM | POA: Diagnosis not present

## 2018-11-26 DIAGNOSIS — Q2733 Arteriovenous malformation of digestive system vessel: Secondary | ICD-10-CM | POA: Diagnosis not present

## 2018-11-26 DIAGNOSIS — D509 Iron deficiency anemia, unspecified: Secondary | ICD-10-CM | POA: Diagnosis not present

## 2018-11-29 DIAGNOSIS — Z79899 Other long term (current) drug therapy: Secondary | ICD-10-CM | POA: Diagnosis not present

## 2018-11-29 DIAGNOSIS — K921 Melena: Secondary | ICD-10-CM | POA: Diagnosis not present

## 2018-11-29 DIAGNOSIS — R195 Other fecal abnormalities: Secondary | ICD-10-CM | POA: Diagnosis not present

## 2018-11-29 DIAGNOSIS — E079 Disorder of thyroid, unspecified: Secondary | ICD-10-CM | POA: Diagnosis not present

## 2018-11-29 DIAGNOSIS — I10 Essential (primary) hypertension: Secondary | ICD-10-CM | POA: Diagnosis not present

## 2018-11-29 DIAGNOSIS — E7800 Pure hypercholesterolemia, unspecified: Secondary | ICD-10-CM | POA: Diagnosis not present

## 2018-11-29 DIAGNOSIS — K449 Diaphragmatic hernia without obstruction or gangrene: Secondary | ICD-10-CM | POA: Diagnosis not present

## 2018-11-29 DIAGNOSIS — D509 Iron deficiency anemia, unspecified: Secondary | ICD-10-CM | POA: Diagnosis not present

## 2018-11-29 DIAGNOSIS — K31811 Angiodysplasia of stomach and duodenum with bleeding: Secondary | ICD-10-CM | POA: Diagnosis not present

## 2018-11-29 DIAGNOSIS — K31819 Angiodysplasia of stomach and duodenum without bleeding: Secondary | ICD-10-CM | POA: Diagnosis not present

## 2018-12-03 DIAGNOSIS — D649 Anemia, unspecified: Secondary | ICD-10-CM | POA: Diagnosis not present

## 2018-12-03 DIAGNOSIS — D518 Other vitamin B12 deficiency anemias: Secondary | ICD-10-CM | POA: Diagnosis not present

## 2018-12-05 DIAGNOSIS — D509 Iron deficiency anemia, unspecified: Secondary | ICD-10-CM | POA: Diagnosis not present

## 2018-12-05 DIAGNOSIS — Q2733 Arteriovenous malformation of digestive system vessel: Secondary | ICD-10-CM | POA: Diagnosis not present

## 2018-12-05 DIAGNOSIS — K5730 Diverticulosis of large intestine without perforation or abscess without bleeding: Secondary | ICD-10-CM | POA: Diagnosis not present

## 2018-12-19 DIAGNOSIS — D649 Anemia, unspecified: Secondary | ICD-10-CM | POA: Diagnosis not present

## 2018-12-26 DIAGNOSIS — D509 Iron deficiency anemia, unspecified: Secondary | ICD-10-CM | POA: Diagnosis not present

## 2018-12-26 DIAGNOSIS — Q2733 Arteriovenous malformation of digestive system vessel: Secondary | ICD-10-CM | POA: Diagnosis not present

## 2019-01-07 DIAGNOSIS — Z1331 Encounter for screening for depression: Secondary | ICD-10-CM | POA: Diagnosis not present

## 2019-01-07 DIAGNOSIS — I10 Essential (primary) hypertension: Secondary | ICD-10-CM | POA: Diagnosis not present

## 2019-01-07 DIAGNOSIS — Z6833 Body mass index (BMI) 33.0-33.9, adult: Secondary | ICD-10-CM | POA: Diagnosis not present

## 2019-01-07 DIAGNOSIS — Z79899 Other long term (current) drug therapy: Secondary | ICD-10-CM | POA: Diagnosis not present

## 2019-01-07 DIAGNOSIS — E782 Mixed hyperlipidemia: Secondary | ICD-10-CM | POA: Diagnosis not present

## 2019-01-07 DIAGNOSIS — Z23 Encounter for immunization: Secondary | ICD-10-CM | POA: Diagnosis not present

## 2019-01-07 DIAGNOSIS — R5383 Other fatigue: Secondary | ICD-10-CM | POA: Diagnosis not present

## 2019-01-24 DIAGNOSIS — Z6834 Body mass index (BMI) 34.0-34.9, adult: Secondary | ICD-10-CM | POA: Diagnosis not present

## 2019-01-24 DIAGNOSIS — L259 Unspecified contact dermatitis, unspecified cause: Secondary | ICD-10-CM | POA: Diagnosis not present

## 2019-04-02 DIAGNOSIS — I10 Essential (primary) hypertension: Secondary | ICD-10-CM | POA: Diagnosis not present

## 2019-04-02 DIAGNOSIS — R0602 Shortness of breath: Secondary | ICD-10-CM | POA: Diagnosis not present

## 2019-04-02 DIAGNOSIS — Z79899 Other long term (current) drug therapy: Secondary | ICD-10-CM | POA: Diagnosis not present

## 2019-04-02 DIAGNOSIS — I249 Acute ischemic heart disease, unspecified: Secondary | ICD-10-CM | POA: Diagnosis not present

## 2019-04-02 DIAGNOSIS — D509 Iron deficiency anemia, unspecified: Secondary | ICD-10-CM | POA: Diagnosis not present

## 2019-04-02 DIAGNOSIS — E039 Hypothyroidism, unspecified: Secondary | ICD-10-CM | POA: Diagnosis not present

## 2019-04-02 DIAGNOSIS — R58 Hemorrhage, not elsewhere classified: Secondary | ICD-10-CM | POA: Diagnosis not present

## 2019-04-02 DIAGNOSIS — R079 Chest pain, unspecified: Secondary | ICD-10-CM | POA: Diagnosis not present

## 2019-04-02 DIAGNOSIS — E785 Hyperlipidemia, unspecified: Secondary | ICD-10-CM | POA: Diagnosis not present

## 2019-04-05 DIAGNOSIS — R0789 Other chest pain: Secondary | ICD-10-CM | POA: Diagnosis not present

## 2019-04-05 DIAGNOSIS — E782 Mixed hyperlipidemia: Secondary | ICD-10-CM | POA: Diagnosis not present

## 2019-04-05 DIAGNOSIS — R5381 Other malaise: Secondary | ICD-10-CM | POA: Diagnosis not present

## 2019-04-05 DIAGNOSIS — Z6833 Body mass index (BMI) 33.0-33.9, adult: Secondary | ICD-10-CM | POA: Diagnosis not present

## 2019-04-05 DIAGNOSIS — R5383 Other fatigue: Secondary | ICD-10-CM | POA: Diagnosis not present

## 2019-04-05 DIAGNOSIS — Z79899 Other long term (current) drug therapy: Secondary | ICD-10-CM | POA: Diagnosis not present

## 2019-04-09 ENCOUNTER — Telehealth: Payer: Self-pay | Admitting: *Deleted

## 2019-04-09 DIAGNOSIS — D649 Anemia, unspecified: Secondary | ICD-10-CM | POA: Diagnosis not present

## 2019-04-09 NOTE — Telephone Encounter (Signed)
YOUR CARDIOLOGY TEAM HAS ARRANGED FOR AN E-VISIT FOR YOUR APPOINTMENT - PLEASE REVIEW IMPORTANT INFORMATION BELOW SEVERAL DAYS PRIOR TO YOUR APPOINTMENT  Due to the recent COVID-19 pandemic, we are transitioning in-person office visits to tele-medicine visits in an effort to decrease unnecessary exposure to our patients, their families, and staff. These visits are billed to your insurance just like a normal visit is. We also encourage you to sign up for MyChart if you have not already done so. You will need a smartphone if possible. For patients that do not have this, we can still complete the visit using a regular telephone but do prefer a smartphone to enable video when possible. You may have a family member that lives with you that can help. If possible, we also ask that you have a blood pressure cuff and scale at home to measure your blood pressure, heart rate and weight prior to your scheduled appointment. Patients with clinical needs that need an in-person evaluation and testing will still be able to come to the office if absolutely necessary. If you have any questions, feel free to call our office.     YOUR PROVIDER WILL BE USING THE FOLLOWING PLATFORM TO COMPLETE YOUR VISIT: Staff: Please delete this text and fill in MyChart/[email or web address]  . IF USING MYCHART - How to Download the MyChart App to Your SmartPhone   - If Apple, go to App Store and type in MyChart in the search bar and download the app. If Android, ask patient to go to Google Play Store and type in MyChart in the search bar and download the app. The app is free but as with any other app downloads, your phone may require you to verify saved payment information or Apple/Android password.  - You will need to then log into the app with your MyChart username and password, and select [facility] as your healthcare provider to link the account.  - When it is time for your visit, go to the MyChart app, find appointments, and click Begin  Video Visit. Be sure to Select Allow for your device to access the Microphone and Camera for your visit. You will then be connected, and your provider will be with you shortly.  **If you have any issues connecting or need assistance, please contact MyChart service desk [phone] ([phone])**  **If using a computer, in order to ensure the best quality for your visit, you will need to use either of the following Internet Browsers: Google Chrome or Microsoft Edge**  . IF USING DOXIMITY or [email or web address].ME - The staff will give you instructions on receiving your link to join the meeting the day of your visit.      2-3 DAYS BEFORE YOUR APPOINTMENT  You will receive a telephone call from one of our [facility] team members - your caller ID may say "Unknown caller." If this is a video visit, we will walk you through how to get the video launched on your phone. We will remind you check your blood pressure, heart rate and weight prior to your scheduled appointment. If you have an Apple Watch or Kardia, please upload any pertinent ECG strips the day before or morning of your appointment to MyChart. Our staff will also make sure you have reviewed the consent and agree to move forward with your scheduled tele-health visit.     THE DAY OF YOUR APPOINTMENT  Approximately 15 minutes prior to your scheduled appointment, you will receive a telephone call from one of [facility] team -   your caller ID may say "Unknown caller."  Our staff will confirm medications, vital signs for the day and any symptoms you may be experiencing. Please have this information available prior to the time of visit start. It may also be helpful for you to have a pad of paper and pen handy for any instructions given during your visit. They will also walk you through joining the smartphone meeting if this is a video visit.    CONSENT FOR TELE-HEALTH VISIT - PLEASE REVIEW  I hereby voluntarily request, consent and authorize [facility] and  its employed or contracted physicians, physician assistants, nurse practitioners or other licensed health care professionals (the Practitioner), to provide me with telemedicine health care services (the "Services") as deemed necessary by the treating Practitioner. I acknowledge and consent to receive the Services by the Practitioner via telemedicine. I understand that the telemedicine visit will involve communicating with the Practitioner through live audiovisual communication technology and the disclosure of certain medical information by electronic transmission. I acknowledge that I have been given the opportunity to request an in-person assessment or other available alternative prior to the telemedicine visit and am voluntarily participating in the telemedicine visit.  I understand that I have the right to withhold or withdraw my consent to the use of telemedicine in the course of my care at any time, without affecting my right to future care or treatment, and that the Practitioner or I may terminate the telemedicine visit at any time. I understand that I have the right to inspect all information obtained and/or recorded in the course of the telemedicine visit and may receive copies of available information for a reasonable fee.  I understand that some of the potential risks of receiving the Services via telemedicine include:  Marland Kitchen Delay or interruption in medical evaluation due to technological equipment failure or disruption; . Information transmitted may not be sufficient (e.g. poor resolution of images) to allow for appropriate medical decision making by the Practitioner; and/or  . In rare instances, security protocols could fail, causing a breach of personal health information.  Furthermore, I acknowledge that it is my responsibility to provide information about my medical history, conditions and care that is complete and accurate to the best of my ability. I acknowledge that Practitioner's advice,  recommendations, and/or decision may be based on factors not within their control, such as incomplete or inaccurate data provided by me or distortions of diagnostic images or specimens that may result from electronic transmissions. I understand that the practice of medicine is not an exact science and that Practitioner makes no warranties or guarantees regarding treatment outcomes. I acknowledge that I will receive a copy of this consent concurrently upon execution via email to the email address I last provided but may also request a printed copy by calling the office of [facility].    I understand that my insurance will be billed for this visit. Pt gives consent for the virtual visit.  I have read or had this consent read to me. . I understand the contents of this consent, which adequately explains the benefits and risks of the Services being provided via telemedicine.  . I have been provided ample opportunity to ask questions regarding this consent and the Services and have had my questions answered to my satisfaction. . I give my informed consent for the services to be provided through the use of telemedicine in my medical care  By participating in this telemedicine visit I agree to the above.

## 2019-04-10 DIAGNOSIS — D509 Iron deficiency anemia, unspecified: Secondary | ICD-10-CM | POA: Diagnosis not present

## 2019-04-10 DIAGNOSIS — Q2733 Arteriovenous malformation of digestive system vessel: Secondary | ICD-10-CM | POA: Diagnosis not present

## 2019-04-11 DIAGNOSIS — D649 Anemia, unspecified: Secondary | ICD-10-CM | POA: Diagnosis not present

## 2019-04-12 ENCOUNTER — Telehealth: Payer: Medicare Other | Admitting: Cardiology

## 2019-04-12 DIAGNOSIS — K31819 Angiodysplasia of stomach and duodenum without bleeding: Secondary | ICD-10-CM | POA: Diagnosis not present

## 2019-04-12 DIAGNOSIS — Z9049 Acquired absence of other specified parts of digestive tract: Secondary | ICD-10-CM | POA: Diagnosis not present

## 2019-04-12 DIAGNOSIS — D509 Iron deficiency anemia, unspecified: Secondary | ICD-10-CM | POA: Diagnosis not present

## 2019-04-12 DIAGNOSIS — I10 Essential (primary) hypertension: Secondary | ICD-10-CM | POA: Diagnosis not present

## 2019-04-12 DIAGNOSIS — K921 Melena: Secondary | ICD-10-CM | POA: Diagnosis not present

## 2019-04-12 DIAGNOSIS — Z79899 Other long term (current) drug therapy: Secondary | ICD-10-CM | POA: Diagnosis not present

## 2019-04-12 DIAGNOSIS — Z90710 Acquired absence of both cervix and uterus: Secondary | ICD-10-CM | POA: Diagnosis not present

## 2019-04-12 DIAGNOSIS — Q2733 Arteriovenous malformation of digestive system vessel: Secondary | ICD-10-CM | POA: Diagnosis not present

## 2019-04-12 DIAGNOSIS — K3189 Other diseases of stomach and duodenum: Secondary | ICD-10-CM | POA: Diagnosis not present

## 2019-04-18 DIAGNOSIS — D649 Anemia, unspecified: Secondary | ICD-10-CM | POA: Diagnosis not present

## 2019-04-19 DIAGNOSIS — K5521 Angiodysplasia of colon with hemorrhage: Secondary | ICD-10-CM | POA: Diagnosis not present

## 2019-04-19 DIAGNOSIS — D509 Iron deficiency anemia, unspecified: Secondary | ICD-10-CM | POA: Diagnosis not present

## 2019-04-19 DIAGNOSIS — D508 Other iron deficiency anemias: Secondary | ICD-10-CM | POA: Diagnosis not present

## 2019-04-26 DIAGNOSIS — R0602 Shortness of breath: Secondary | ICD-10-CM | POA: Diagnosis not present

## 2019-04-26 DIAGNOSIS — R110 Nausea: Secondary | ICD-10-CM | POA: Diagnosis not present

## 2019-04-26 DIAGNOSIS — N3000 Acute cystitis without hematuria: Secondary | ICD-10-CM | POA: Diagnosis not present

## 2019-04-26 DIAGNOSIS — Z03818 Encounter for observation for suspected exposure to other biological agents ruled out: Secondary | ICD-10-CM | POA: Diagnosis not present

## 2019-04-26 DIAGNOSIS — B9689 Other specified bacterial agents as the cause of diseases classified elsewhere: Secondary | ICD-10-CM | POA: Diagnosis not present

## 2019-04-26 DIAGNOSIS — R531 Weakness: Secondary | ICD-10-CM | POA: Diagnosis not present

## 2019-04-26 DIAGNOSIS — E7800 Pure hypercholesterolemia, unspecified: Secondary | ICD-10-CM | POA: Diagnosis not present

## 2019-04-26 DIAGNOSIS — K219 Gastro-esophageal reflux disease without esophagitis: Secondary | ICD-10-CM | POA: Diagnosis not present

## 2019-04-26 DIAGNOSIS — R000 Tachycardia, unspecified: Secondary | ICD-10-CM | POA: Diagnosis not present

## 2019-04-26 DIAGNOSIS — I10 Essential (primary) hypertension: Secondary | ICD-10-CM | POA: Diagnosis not present

## 2019-04-26 DIAGNOSIS — R509 Fever, unspecified: Secondary | ICD-10-CM | POA: Diagnosis not present

## 2019-04-27 DIAGNOSIS — R509 Fever, unspecified: Secondary | ICD-10-CM | POA: Diagnosis not present

## 2019-04-27 DIAGNOSIS — R0602 Shortness of breath: Secondary | ICD-10-CM | POA: Diagnosis not present

## 2019-05-03 DIAGNOSIS — Z6833 Body mass index (BMI) 33.0-33.9, adult: Secondary | ICD-10-CM | POA: Diagnosis not present

## 2019-05-03 DIAGNOSIS — R932 Abnormal findings on diagnostic imaging of liver and biliary tract: Secondary | ICD-10-CM | POA: Diagnosis not present

## 2019-05-03 DIAGNOSIS — N390 Urinary tract infection, site not specified: Secondary | ICD-10-CM | POA: Diagnosis not present

## 2019-05-09 DIAGNOSIS — K3189 Other diseases of stomach and duodenum: Secondary | ICD-10-CM | POA: Diagnosis not present

## 2019-05-09 DIAGNOSIS — D509 Iron deficiency anemia, unspecified: Secondary | ICD-10-CM | POA: Diagnosis not present

## 2019-06-12 DIAGNOSIS — D649 Anemia, unspecified: Secondary | ICD-10-CM | POA: Diagnosis not present

## 2019-07-09 DIAGNOSIS — D649 Anemia, unspecified: Secondary | ICD-10-CM | POA: Diagnosis not present

## 2019-07-10 DIAGNOSIS — Q2733 Arteriovenous malformation of digestive system vessel: Secondary | ICD-10-CM | POA: Diagnosis not present

## 2019-07-10 DIAGNOSIS — D509 Iron deficiency anemia, unspecified: Secondary | ICD-10-CM | POA: Diagnosis not present

## 2019-07-10 DIAGNOSIS — K5900 Constipation, unspecified: Secondary | ICD-10-CM | POA: Diagnosis not present

## 2019-07-30 DIAGNOSIS — E782 Mixed hyperlipidemia: Secondary | ICD-10-CM | POA: Diagnosis not present

## 2019-07-30 DIAGNOSIS — Z6834 Body mass index (BMI) 34.0-34.9, adult: Secondary | ICD-10-CM | POA: Diagnosis not present

## 2019-07-30 DIAGNOSIS — Z0000 Encounter for general adult medical examination without abnormal findings: Secondary | ICD-10-CM | POA: Diagnosis not present

## 2019-07-30 DIAGNOSIS — Z1331 Encounter for screening for depression: Secondary | ICD-10-CM | POA: Diagnosis not present

## 2019-07-30 DIAGNOSIS — Z23 Encounter for immunization: Secondary | ICD-10-CM | POA: Diagnosis not present

## 2019-08-20 DIAGNOSIS — Q2733 Arteriovenous malformation of digestive system vessel: Secondary | ICD-10-CM | POA: Diagnosis not present

## 2019-08-20 DIAGNOSIS — D500 Iron deficiency anemia secondary to blood loss (chronic): Secondary | ICD-10-CM | POA: Diagnosis not present

## 2019-08-20 DIAGNOSIS — D509 Iron deficiency anemia, unspecified: Secondary | ICD-10-CM | POA: Diagnosis not present

## 2019-11-22 DIAGNOSIS — Z6835 Body mass index (BMI) 35.0-35.9, adult: Secondary | ICD-10-CM | POA: Diagnosis not present

## 2019-11-22 DIAGNOSIS — R600 Localized edema: Secondary | ICD-10-CM | POA: Diagnosis not present

## 2019-11-22 DIAGNOSIS — Z1331 Encounter for screening for depression: Secondary | ICD-10-CM | POA: Diagnosis not present

## 2019-11-22 DIAGNOSIS — I10 Essential (primary) hypertension: Secondary | ICD-10-CM | POA: Diagnosis not present

## 2019-11-22 DIAGNOSIS — Z9181 History of falling: Secondary | ICD-10-CM | POA: Diagnosis not present

## 2019-11-22 DIAGNOSIS — Z79899 Other long term (current) drug therapy: Secondary | ICD-10-CM | POA: Diagnosis not present

## 2019-12-05 DIAGNOSIS — D510 Vitamin B12 deficiency anemia due to intrinsic factor deficiency: Secondary | ICD-10-CM | POA: Diagnosis not present

## 2019-12-05 DIAGNOSIS — D649 Anemia, unspecified: Secondary | ICD-10-CM | POA: Diagnosis not present

## 2019-12-11 DIAGNOSIS — K5900 Constipation, unspecified: Secondary | ICD-10-CM | POA: Diagnosis not present

## 2019-12-11 DIAGNOSIS — D500 Iron deficiency anemia secondary to blood loss (chronic): Secondary | ICD-10-CM | POA: Diagnosis not present

## 2019-12-11 DIAGNOSIS — D509 Iron deficiency anemia, unspecified: Secondary | ICD-10-CM | POA: Diagnosis not present

## 2019-12-11 DIAGNOSIS — Q2733 Arteriovenous malformation of digestive system vessel: Secondary | ICD-10-CM | POA: Diagnosis not present

## 2019-12-17 DIAGNOSIS — D509 Iron deficiency anemia, unspecified: Secondary | ICD-10-CM | POA: Diagnosis not present

## 2019-12-24 DIAGNOSIS — D509 Iron deficiency anemia, unspecified: Secondary | ICD-10-CM | POA: Diagnosis not present

## 2020-01-06 DIAGNOSIS — D649 Anemia, unspecified: Secondary | ICD-10-CM | POA: Diagnosis not present

## 2020-01-06 DIAGNOSIS — K921 Melena: Secondary | ICD-10-CM | POA: Diagnosis not present

## 2020-01-06 LAB — LAB REPORT - SCANNED: EGFR: 47

## 2020-02-04 DIAGNOSIS — D649 Anemia, unspecified: Secondary | ICD-10-CM | POA: Diagnosis not present

## 2020-02-05 DIAGNOSIS — Q2733 Arteriovenous malformation of digestive system vessel: Secondary | ICD-10-CM | POA: Diagnosis not present

## 2020-02-05 DIAGNOSIS — D509 Iron deficiency anemia, unspecified: Secondary | ICD-10-CM | POA: Diagnosis not present

## 2020-02-05 DIAGNOSIS — K5900 Constipation, unspecified: Secondary | ICD-10-CM | POA: Diagnosis not present

## 2020-02-18 DIAGNOSIS — Q2733 Arteriovenous malformation of digestive system vessel: Secondary | ICD-10-CM | POA: Diagnosis not present

## 2020-02-18 DIAGNOSIS — D509 Iron deficiency anemia, unspecified: Secondary | ICD-10-CM | POA: Diagnosis not present

## 2020-02-18 DIAGNOSIS — Z23 Encounter for immunization: Secondary | ICD-10-CM | POA: Diagnosis not present

## 2020-02-18 DIAGNOSIS — D500 Iron deficiency anemia secondary to blood loss (chronic): Secondary | ICD-10-CM | POA: Diagnosis not present

## 2020-03-16 DIAGNOSIS — Z20828 Contact with and (suspected) exposure to other viral communicable diseases: Secondary | ICD-10-CM | POA: Diagnosis not present

## 2020-03-16 DIAGNOSIS — R509 Fever, unspecified: Secondary | ICD-10-CM | POA: Diagnosis not present

## 2020-03-16 DIAGNOSIS — J40 Bronchitis, not specified as acute or chronic: Secondary | ICD-10-CM | POA: Diagnosis not present

## 2020-03-16 DIAGNOSIS — J329 Chronic sinusitis, unspecified: Secondary | ICD-10-CM | POA: Diagnosis not present

## 2020-03-27 DIAGNOSIS — Z6835 Body mass index (BMI) 35.0-35.9, adult: Secondary | ICD-10-CM | POA: Diagnosis not present

## 2020-03-27 DIAGNOSIS — R0600 Dyspnea, unspecified: Secondary | ICD-10-CM | POA: Diagnosis not present

## 2020-03-27 DIAGNOSIS — R5383 Other fatigue: Secondary | ICD-10-CM | POA: Diagnosis not present

## 2020-03-27 DIAGNOSIS — D510 Vitamin B12 deficiency anemia due to intrinsic factor deficiency: Secondary | ICD-10-CM | POA: Diagnosis not present

## 2020-04-06 DIAGNOSIS — Z23 Encounter for immunization: Secondary | ICD-10-CM | POA: Diagnosis not present

## 2020-04-07 DIAGNOSIS — N390 Urinary tract infection, site not specified: Secondary | ICD-10-CM | POA: Diagnosis not present

## 2020-04-07 DIAGNOSIS — Z6832 Body mass index (BMI) 32.0-32.9, adult: Secondary | ICD-10-CM | POA: Diagnosis not present

## 2020-06-11 DIAGNOSIS — Z961 Presence of intraocular lens: Secondary | ICD-10-CM | POA: Diagnosis not present

## 2020-06-11 DIAGNOSIS — Z9849 Cataract extraction status, unspecified eye: Secondary | ICD-10-CM | POA: Diagnosis not present

## 2020-06-11 DIAGNOSIS — H52223 Regular astigmatism, bilateral: Secondary | ICD-10-CM | POA: Diagnosis not present

## 2020-06-11 DIAGNOSIS — H524 Presbyopia: Secondary | ICD-10-CM | POA: Diagnosis not present

## 2020-06-11 DIAGNOSIS — H5203 Hypermetropia, bilateral: Secondary | ICD-10-CM | POA: Diagnosis not present

## 2020-07-22 ENCOUNTER — Emergency Department (HOSPITAL_COMMUNITY): Payer: Medicare Other

## 2020-07-22 ENCOUNTER — Other Ambulatory Visit: Payer: Self-pay

## 2020-07-22 ENCOUNTER — Encounter (HOSPITAL_COMMUNITY): Payer: Self-pay | Admitting: Emergency Medicine

## 2020-07-22 ENCOUNTER — Emergency Department (HOSPITAL_COMMUNITY)
Admission: EM | Admit: 2020-07-22 | Discharge: 2020-07-23 | Disposition: A | Discharge status: Home / Self Care | Payer: Medicare Other | Attending: Emergency Medicine | Admitting: Emergency Medicine

## 2020-07-22 DIAGNOSIS — Z5321 Procedure and treatment not carried out due to patient leaving prior to being seen by health care provider: Secondary | ICD-10-CM | POA: Insufficient documentation

## 2020-07-22 DIAGNOSIS — G459 Transient cerebral ischemic attack, unspecified: Secondary | ICD-10-CM | POA: Diagnosis not present

## 2020-07-22 DIAGNOSIS — R202 Paresthesia of skin: Secondary | ICD-10-CM | POA: Insufficient documentation

## 2020-07-22 DIAGNOSIS — R519 Headache, unspecified: Secondary | ICD-10-CM | POA: Insufficient documentation

## 2020-07-22 DIAGNOSIS — G4489 Other headache syndrome: Secondary | ICD-10-CM | POA: Diagnosis not present

## 2020-07-22 DIAGNOSIS — T68XXXA Hypothermia, initial encounter: Secondary | ICD-10-CM | POA: Diagnosis not present

## 2020-07-22 DIAGNOSIS — R52 Pain, unspecified: Secondary | ICD-10-CM | POA: Diagnosis not present

## 2020-07-22 LAB — DIFFERENTIAL
Abs Immature Granulocytes: 0.05 10*3/uL (ref 0.00–0.07)
Basophils Absolute: 0.1 10*3/uL (ref 0.0–0.1)
Basophils Relative: 1 %
Eosinophils Absolute: 0.2 10*3/uL (ref 0.0–0.5)
Eosinophils Relative: 2 %
Immature Granulocytes: 1 %
Lymphocytes Relative: 23 %
Lymphs Abs: 2.1 10*3/uL (ref 0.7–4.0)
Monocytes Absolute: 0.7 10*3/uL (ref 0.1–1.0)
Monocytes Relative: 8 %
Neutro Abs: 6.1 10*3/uL (ref 1.7–7.7)
Neutrophils Relative %: 65 %

## 2020-07-22 LAB — COMPREHENSIVE METABOLIC PANEL
ALT: 14 U/L (ref 0–44)
AST: 22 U/L (ref 15–41)
Albumin: 4.3 g/dL (ref 3.5–5.0)
Alkaline Phosphatase: 53 U/L (ref 38–126)
Anion gap: 10 (ref 5–15)
BUN: 18 mg/dL (ref 8–23)
CO2: 24 mmol/L (ref 22–32)
Calcium: 9.5 mg/dL (ref 8.9–10.3)
Chloride: 106 mmol/L (ref 98–111)
Creatinine, Ser: 1.15 mg/dL — ABNORMAL HIGH (ref 0.44–1.00)
GFR calc Af Amer: 51 mL/min — ABNORMAL LOW (ref >60)
GFR calc non Af Amer: 44 mL/min — ABNORMAL LOW (ref >60)
Glucose, Bld: 90 mg/dL (ref 70–99)
Potassium: 4.7 mmol/L (ref 3.5–5.1)
Sodium: 140 mmol/L (ref 135–145)
Total Bilirubin: 0.3 mg/dL (ref 0.3–1.2)
Total Protein: 7 g/dL (ref 6.5–8.1)

## 2020-07-22 LAB — CBC
HCT: 40.6 % (ref 36.0–46.0)
Hemoglobin: 12.9 g/dL (ref 12.0–15.0)
MCH: 32.1 pg (ref 26.0–34.0)
MCHC: 31.8 g/dL (ref 30.0–36.0)
MCV: 101 fL — ABNORMAL HIGH (ref 80.0–100.0)
Platelets: 275 10*3/uL (ref 150–400)
RBC: 4.02 MIL/uL (ref 3.87–5.11)
RDW: 13.3 % (ref 11.5–15.5)
WBC: 9.1 10*3/uL (ref 4.0–10.5)
nRBC: 0 % (ref 0.0–0.2)

## 2020-07-22 LAB — APTT: aPTT: 29 seconds (ref 24–36)

## 2020-07-22 LAB — PROTIME-INR
INR: 1 (ref 0.8–1.2)
Prothrombin Time: 12.3 seconds (ref 11.4–15.2)

## 2020-07-22 MED ORDER — SODIUM CHLORIDE 0.9% FLUSH: 3.0000 mL | Freq: Once | INTRAVENOUS | Status: DC

## 2020-07-22 NOTE — ED Notes (Signed)
Patient states her family is in medical field and she wants to leave and she will contact medical records to get the results of CT if the doctor doesn't call to tell her. IV taken out by this NT

## 2020-07-22 NOTE — ED Triage Notes (Addendum)
Patient arrives to ED with complaints of right sided facial, arm and leg numbness since Saturday at 2am. Pt states the pain woke her up from her sleep and is now constant. Pt states that she now has a constant headache that is sharp. Pt states when she doesn't touch her face, arm, leg it is a numb feeling but when she touches them it is painful and sharp. Pt states she also cannot smile or move both sides of her face.

## 2020-07-31 DIAGNOSIS — I10 Essential (primary) hypertension: Secondary | ICD-10-CM | POA: Diagnosis not present

## 2020-07-31 DIAGNOSIS — Z8673 Personal history of transient ischemic attack (TIA), and cerebral infarction without residual deficits: Secondary | ICD-10-CM | POA: Diagnosis not present

## 2020-07-31 DIAGNOSIS — Z6832 Body mass index (BMI) 32.0-32.9, adult: Secondary | ICD-10-CM | POA: Diagnosis not present

## 2020-07-31 DIAGNOSIS — E7800 Pure hypercholesterolemia, unspecified: Secondary | ICD-10-CM | POA: Diagnosis not present

## 2020-08-04 DIAGNOSIS — Z79899 Other long term (current) drug therapy: Secondary | ICD-10-CM | POA: Diagnosis not present

## 2020-08-06 ENCOUNTER — Other Ambulatory Visit: Payer: Self-pay | Admitting: Family Medicine

## 2020-08-06 DIAGNOSIS — Z823 Family history of stroke: Secondary | ICD-10-CM

## 2020-08-19 ENCOUNTER — Other Ambulatory Visit: Payer: Self-pay | Admitting: Hematology and Oncology

## 2020-08-19 DIAGNOSIS — D500 Iron deficiency anemia secondary to blood loss (chronic): Secondary | ICD-10-CM | POA: Diagnosis not present

## 2020-08-19 DIAGNOSIS — Q2733 Arteriovenous malformation of digestive system vessel: Secondary | ICD-10-CM | POA: Diagnosis not present

## 2020-08-19 LAB — CBC AND DIFFERENTIAL
HCT: 38 (ref 36–46)
Hemoglobin: 12.8 (ref 12.0–16.0)
Platelets: 251 (ref 150–399)
WBC: 7.9

## 2020-08-19 LAB — IRON,TIBC AND FERRITIN PANEL
%SAT: 43.7
Ferritin: 16.5
Iron: 150
TIBC: 343

## 2020-08-19 LAB — CBC: RBC: 3.94 (ref 3.87–5.11)

## 2020-08-29 ENCOUNTER — Other Ambulatory Visit: Payer: Self-pay

## 2020-08-29 ENCOUNTER — Ambulatory Visit
Admission: RE | Admit: 2020-08-29 | Discharge: 2020-08-29 | Disposition: A | Discharge status: Home / Self Care | Payer: Medicare Other | Source: Ambulatory Visit | Attending: Family Medicine | Admitting: Family Medicine

## 2020-08-29 DIAGNOSIS — Z823 Family history of stroke: Secondary | ICD-10-CM

## 2020-09-22 DIAGNOSIS — R5383 Other fatigue: Secondary | ICD-10-CM | POA: Diagnosis not present

## 2020-09-22 DIAGNOSIS — Z23 Encounter for immunization: Secondary | ICD-10-CM | POA: Diagnosis not present

## 2020-09-22 DIAGNOSIS — E7800 Pure hypercholesterolemia, unspecified: Secondary | ICD-10-CM | POA: Diagnosis not present

## 2020-09-22 DIAGNOSIS — E559 Vitamin D deficiency, unspecified: Secondary | ICD-10-CM | POA: Diagnosis not present

## 2020-09-22 DIAGNOSIS — Z79899 Other long term (current) drug therapy: Secondary | ICD-10-CM | POA: Diagnosis not present

## 2020-09-22 DIAGNOSIS — M8580 Other specified disorders of bone density and structure, unspecified site: Secondary | ICD-10-CM | POA: Diagnosis not present

## 2020-09-22 DIAGNOSIS — R29818 Other symptoms and signs involving the nervous system: Secondary | ICD-10-CM | POA: Diagnosis not present

## 2020-09-22 DIAGNOSIS — Z0000 Encounter for general adult medical examination without abnormal findings: Secondary | ICD-10-CM | POA: Diagnosis not present

## 2020-09-22 DIAGNOSIS — I10 Essential (primary) hypertension: Secondary | ICD-10-CM | POA: Diagnosis not present

## 2020-09-22 DIAGNOSIS — Z6834 Body mass index (BMI) 34.0-34.9, adult: Secondary | ICD-10-CM | POA: Diagnosis not present

## 2020-09-22 LAB — HM DEXA SCAN

## 2020-09-24 ENCOUNTER — Other Ambulatory Visit: Payer: Self-pay | Admitting: Family Medicine

## 2020-09-24 DIAGNOSIS — Z8673 Personal history of transient ischemic attack (TIA), and cerebral infarction without residual deficits: Secondary | ICD-10-CM

## 2020-11-03 ENCOUNTER — Ambulatory Visit
Admission: RE | Admit: 2020-11-03 | Discharge: 2020-11-03 | Disposition: A | Discharge status: Home / Self Care | Payer: Medicare Other | Source: Ambulatory Visit | Attending: Family Medicine | Admitting: Family Medicine

## 2020-11-03 ENCOUNTER — Other Ambulatory Visit: Payer: Self-pay

## 2020-11-03 DIAGNOSIS — Q279 Congenital malformation of peripheral vascular system, unspecified: Secondary | ICD-10-CM | POA: Diagnosis not present

## 2020-11-03 DIAGNOSIS — Z8673 Personal history of transient ischemic attack (TIA), and cerebral infarction without residual deficits: Secondary | ICD-10-CM

## 2020-11-03 DIAGNOSIS — Z9889 Other specified postprocedural states: Secondary | ICD-10-CM | POA: Diagnosis not present

## 2020-11-03 MED ORDER — GADOBENATE DIMEGLUMINE 529 MG/ML IV SOLN
18.0000 mL | Freq: Once | INTRAVENOUS | Status: AC | PRN
  Administered 2020-11-03: 11:00:00 18 mL via INTRAVENOUS

## 2020-11-09 ENCOUNTER — Ambulatory Visit (INDEPENDENT_AMBULATORY_CARE_PROVIDER_SITE_OTHER): Payer: Medicare Other | Admitting: Diagnostic Neuroimaging

## 2020-11-09 ENCOUNTER — Encounter: Payer: Self-pay | Admitting: *Deleted

## 2020-11-09 ENCOUNTER — Other Ambulatory Visit: Payer: Self-pay

## 2020-11-09 ENCOUNTER — Other Ambulatory Visit: Payer: Self-pay | Admitting: *Deleted

## 2020-11-09 VITALS — BP 134/70 | HR 75 | Ht 64.0 in | Wt 198.6 lb

## 2020-11-09 DIAGNOSIS — G459 Transient cerebral ischemic attack, unspecified: Secondary | ICD-10-CM | POA: Diagnosis not present

## 2020-11-09 DIAGNOSIS — G43109 Migraine with aura, not intractable, without status migrainosus: Secondary | ICD-10-CM

## 2020-11-09 DIAGNOSIS — R200 Anesthesia of skin: Secondary | ICD-10-CM | POA: Diagnosis not present

## 2020-11-09 DIAGNOSIS — R519 Headache, unspecified: Secondary | ICD-10-CM | POA: Diagnosis not present

## 2020-11-09 NOTE — Patient Instructions (Signed)
RIGHT FACE / ARM / LEG NUMBNESS; RIGHT SIDED HEADACHES AND VISUAL PHENOMENON (has h/o migraine as child; suspect new migraine phenomenon; TIA / stroke possible but less likely)  - check ultrasound heart and carotid arteries  - continue BP, lipid control  - consider aspirin 81mg  daily (h/o small intestine AVMs and GI bleeding in the past; patient will discuss with GI clinic)

## 2020-11-09 NOTE — Progress Notes (Signed)
[facility]  PATIENT: Alexis Stevenson DOB: 06-04-1934  REFERRING CLINICIAN: Angelina Sheriff, MD HISTORY FROM: Patient and daughter REASON FOR VISIT: New consult   HISTORICAL  CHIEF COMPLAINT:  Chief Complaint  Patient presents with  . History of Stroke    Rm 6 New Pt dgtr- Kay    HISTORY OF PRESENT ILLNESS:    86 year old female with hypertension and hyperlipidemia, here for evaluation of possible stroke.  07/18/2020 patient had onset of right parietal pain, flashing pain over right forehead, and tingling sensation on her right face arm and leg.  Symptoms were intermittent over time.  Patient went to urgent care and then emergency room.  CT scan head was unremarkable.  Patient left ER due to prolonged wait before she could be fully evaluated.  She went to PCP for further evaluation.  Since that time symptoms have improved.  She only has some mild intermittent numbness on the right side and mild right sided headaches.  She has some associated spots and sparkles with the headaches.  She has history of migraine as a child.    REVIEW OF SYSTEMS: Full 14 system review of systems performed and negative with exception of: As per HPI.  ALLERGIES: Allergies  Allergen Reactions  . Codeine Nausea And Vomiting  . Effexor Xr [Venlafaxine Hcl]     intolerant  . Furosemide     intolerant  . Lidocaine Hypertension  . Loratadine Hives  . Naproxen Hypertension    All Nsaids  . Nsaids   . Penicillin G Other (See Comments)    Mouth raw and yeast infection  . Plendil [Felodipine] Other (See Comments)    constipation  . Spiriva Respimat [Tiotropium Bromide Monohydrate]     Heart race    HOME MEDICATIONS: Outpatient Medications Prior to Visit  Medication Sig Dispense Refill  . Cholecalciferol (D3 PO) Take by mouth daily. Dose unknown    . cyanocobalamin 1000 MCG tablet Take by mouth daily. 2500 mcg    . diphenhydramine-acetaminophen (TYLENOL PM) 25-500 MG TABS  tablet Take by mouth. Nightly as needed    . ferrous sulfate 325 (65 FE) MG tablet Take by mouth 3 (three) times daily.    Marland Kitchen levothyroxine (SYNTHROID) 100 MCG tablet Take 100 mcg by mouth daily.    Marland Kitchen olmesartan (BENICAR) 20 MG tablet Take 20 mg by mouth daily.    Marland Kitchen omeprazole (PRILOSEC) 20 MG capsule Take 20 mg by mouth daily.    . rosuvastatin (CRESTOR) 20 MG tablet Take 20 mg by mouth daily.    Marland Kitchen senna (SENOKOT) 8.6 MG tablet Take by mouth as needed.    . Vitamin D, Ergocalciferol, (DRISDOL) 1.25 MG (50000 UNIT) CAPS capsule Take 50,000 Units by mouth 2 (two) times a week.    . simvastatin (ZOCOR) 20 MG tablet Take 20 mg by mouth at bedtime.     No facility-administered medications prior to visit.    PAST MEDICAL HISTORY: Past Medical History:  Diagnosis Date  . Anemia   . AVM (arteriovenous malformation)    small intestines  . Essential hypertension   . Hyperlipidemia   . Hypothyroidism   . Imbalance    chronic  . Osteopenia   . Stroke [facility])    H/O    PAST SURGICAL HISTORY: Past Surgical History:  Procedure Laterality Date  . APPENDECTOMY    . CATARACT EXTRACTION    . CERVICAL SPINE SURGERY     rod in place, C2-7  . CESAREAN  SECTION     BTL  . CHOLECYSTECTOMY    . HYSTERECTOMY ABDOMINAL WITH SALPINGO-OOPHORECTOMY    . REPLACEMENT TOTAL KNEE Right    x 2  . SHOULDER ARTHROSCOPY Right 2012  . TONSILLECTOMY    . TOTAL HIP ARTHROPLASTY Bilateral [id number]    FAMILY HISTORY: Family History  Problem Relation Age of Onset  . Stroke Mother   . Heart disease Mother   . Hypertension Mother   . Heart attack Father        age 77's  . Lung cancer Brother     SOCIAL HISTORY: Social History   Socioeconomic History  . Marital status: Widowed    Spouse name: Not on file  . Number of children: 4  . Years of education: Not on file  . Highest education level: Associate degree: occupational, Hotel manager, or vocational program  Occupational History    Comment: retired  Corporate treasurer, Press photographer  Tobacco Use  . Smoking status: Former Smoker    Quit date: 11/09/2005    Years since quitting: 15.0  . Smokeless tobacco: Never Used  . Tobacco comment: 1-2 ppd  Substance and Sexual Activity  . Alcohol use: Never  . Drug use: Never  . Sexual activity: Not on file  Other Topics Concern  . Not on file  Social History Narrative   Lives alone   Caffeine- 1 c daily   Social Determinants of Health   Financial Resource Strain: Not on file  Food Insecurity: Not on file  Transportation Needs: Not on file  Physical Activity: Not on file  Stress: Not on file  Social Connections: Not on file  Intimate Partner Violence: Not on file     PHYSICAL EXAM  GENERAL EXAM/CONSTITUTIONAL: Vitals:  Vitals:   11/09/20 0838  BP: 134/70  Pulse: 75  Weight: 198 lb 9.6 oz (90.1 kg)  Height: 5\' 4"  (1.626 m)     Body mass index is 34.09 kg/m. Wt Readings from Last 3 Encounters:  11/09/20 198 lb 9.6 oz (90.1 kg)  07/22/20 190 lb (86.2 kg)     Patient is in no distress; well developed, nourished and groomed; neck is supple  CARDIOVASCULAR:  Examination of carotid arteries is normal; no carotid bruits  Regular rate and rhythm, no murmurs  Examination of peripheral vascular system by observation and palpation is normal  EYES:  Ophthalmoscopic exam of optic discs and posterior segments is normal; no papilledema or hemorrhages  No exam data present  MUSCULOSKELETAL:  Gait, strength, tone, movements noted in Neurologic exam below  NEUROLOGIC: MENTAL STATUS:  No flowsheet data found.  awake, alert, oriented to person, place and time  recent and remote memory intact  normal attention and concentration  language fluent, comprehension intact, naming intact  fund of knowledge appropriate  CRANIAL NERVE:   2nd - no papilledema on fundoscopic exam  2nd, 3rd, 4th, 6th - pupils equal and reactive to light, visual fields full to confrontation, extraocular muscles  intact, no nystagmus  5th - facial sensation symmetric  7th - facial strength symmetric  8th - hearing intact  9th - palate elevates symmetrically, uvula midline  11th - shoulder shrug symmetric  12th - tongue protrusion midline  MOTOR:   normal bulk and tone, full strength in the BUE, BLE  SENSORY:   normal and symmetric to light touch, temperature, vibration; EXCEPT DECR IN FEET AND LEGS  COORDINATION:   finger-nose-finger, fine finger movements normal  REFLEXES:   deep tendon reflexes present and symmetric  GAIT/STATION:   narrow based gait     DIAGNOSTIC DATA (LABS, IMAGING, TESTING) - I reviewed patient records, labs, notes, testing and imaging myself where available.  Lab Results  Component Value Date   WBC 7.9 08/19/2020   HGB 12.8 08/19/2020   HCT 38 08/19/2020   MCV 101.0 (H) 07/22/2020   PLT 251 08/19/2020      Component Value Date/Time   NA 140 07/22/2020 1446   K 4.7 07/22/2020 1446   CL 106 07/22/2020 1446   CO2 24 07/22/2020 1446   GLUCOSE 90 07/22/2020 1446   BUN 18 07/22/2020 1446   CREATININE 1.15 (H) 07/22/2020 1446   CALCIUM 9.5 07/22/2020 1446   PROT 7.0 07/22/2020 1446   ALBUMIN 4.3 07/22/2020 1446   AST 22 07/22/2020 1446   ALT 14 07/22/2020 1446   ALKPHOS 53 07/22/2020 1446   BILITOT 0.3 07/22/2020 1446   GFRNONAA 44 (L) 07/22/2020 1446   GFRAA 51 (L) 07/22/2020 1446   No results found for: CHOL, HDL, LDLCALC, LDLDIRECT, TRIG, CHOLHDL No results found for: HGBA1C No results found for: VITAMINB12 No results found for: TSH   07/22/20 CT head  - No acute intracranial abnormality.  11/03/20 MRI brain [I reviewed images myself and agree with interpretation. -[facility]]  1. No acute intracranial abnormality. 2. Mild diffuse parenchymal volume loss and chronic small vessel ischemia. 3. 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   ASSESSMENT AND PLAN  86 y.o. year old female here with new onset right-sided  headaches, seeing spots and stars, right-sided numbness and tingling.  Could represent migraine phenomenon versus TIA.  MRI of the brain was unremarkable.  Dx:  1. Right facial numbness   2. Right-sided headache   3. Migraine with aura and without status migrainosus, not intractable     PLAN:  RIGHT FACE / ARM / LEG NUMBNESS; RIGHT SIDED HEADACHES AND VISUAL PHENOMENON (has h/o migraine as child; suspect new migraine phenomenon; TIA / stroke possible but less likely) - check TTE, carotid u/s to complete stroke work-up - continue BP, lipid control (on rosuvastatin) - consider aspirin 81mg  daily (h/o small intestine AVMs and GI bleeding in the past; patient will discuss with GI clinic)  Orders Placed This Encounter  Procedures  . ECHOCARDIOGRAM COMPLETE BUBBLE STUDY  . VAS US CAROTID   Return pending test results, for pending if symptoms worsen or fail to improve.    Penni Bombard, MD [id number], [id number] AM Certified in Neurology, Neurophysiology and Neuroimaging  [facility] [address], [place], [place] [address] [phone]

## 2020-11-24 DIAGNOSIS — R948 Abnormal results of function studies of other organs and systems: Secondary | ICD-10-CM | POA: Diagnosis not present

## 2020-11-24 DIAGNOSIS — D649 Anemia, unspecified: Secondary | ICD-10-CM | POA: Diagnosis not present

## 2020-12-09 DIAGNOSIS — Q2733 Arteriovenous malformation of digestive system vessel: Secondary | ICD-10-CM | POA: Diagnosis not present

## 2020-12-09 DIAGNOSIS — D509 Iron deficiency anemia, unspecified: Secondary | ICD-10-CM | POA: Diagnosis not present

## 2020-12-22 DIAGNOSIS — M792 Neuralgia and neuritis, unspecified: Secondary | ICD-10-CM | POA: Diagnosis not present

## 2020-12-22 DIAGNOSIS — I10 Essential (primary) hypertension: Secondary | ICD-10-CM | POA: Diagnosis not present

## 2020-12-22 DIAGNOSIS — Z6834 Body mass index (BMI) 34.0-34.9, adult: Secondary | ICD-10-CM | POA: Diagnosis not present

## 2020-12-22 DIAGNOSIS — E782 Mixed hyperlipidemia: Secondary | ICD-10-CM | POA: Diagnosis not present

## 2021-01-04 ENCOUNTER — Other Ambulatory Visit: Payer: Medicare Other

## 2021-02-09 DIAGNOSIS — K921 Melena: Secondary | ICD-10-CM | POA: Diagnosis not present

## 2021-02-10 DIAGNOSIS — K921 Melena: Secondary | ICD-10-CM | POA: Diagnosis not present

## 2021-02-10 DIAGNOSIS — Q2733 Arteriovenous malformation of digestive system vessel: Secondary | ICD-10-CM | POA: Diagnosis not present

## 2021-02-10 DIAGNOSIS — D509 Iron deficiency anemia, unspecified: Secondary | ICD-10-CM | POA: Diagnosis not present

## 2021-02-16 ENCOUNTER — Other Ambulatory Visit: Payer: Self-pay | Admitting: Oncology

## 2021-02-16 DIAGNOSIS — D500 Iron deficiency anemia secondary to blood loss (chronic): Secondary | ICD-10-CM

## 2021-02-16 NOTE — Progress Notes (Signed)
[facility]  [address] [place],  [place]  [address] [phone]  Clinic Day:  02/17/2021  Referring physician: Angelina Sheriff, MD   HISTORY OF PRESENT ILLNESS:  The patient is an 87 y.o. female with iron deficiency anemia.  In the past, IV Feraheme was necessary to replenish her iron stores and improve her hemoglobin.  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  She comes in today for routine follow up.  Since her last visit, the patient has been doing fairly well.  She has one episode of rectal bleeding 2 weeks ago.  She went to her GI doctor's office about this, for which a CBC was ordered.  Her hemoglobin came back normal.  She denies having other overt forms of blood loss over these past months.    PHYSICAL EXAM:  Blood pressure (!) 177/81, pulse 67, temperature 98.6 F (37 C), resp. rate 16, height 5\' 4"  (1.626 m), weight 200 lb 9.6 oz (91 kg), SpO2 97 %. Wt Readings from Last 3 Encounters:  02/17/21 200 lb 9.6 oz (91 kg)  11/09/20 198 lb 9.6 oz (90.1 kg)  07/22/20 190 lb (86.2 kg)   Body mass index is 34.43 kg/m. Performance status (ECOG): 1 Physical Exam Constitutional:      Appearance: Normal appearance. She is not ill-appearing.  HENT:     Mouth/Throat:     Mouth: Mucous membranes are moist.     Pharynx: Oropharynx is clear. No oropharyngeal exudate or posterior oropharyngeal erythema.  Cardiovascular:     Rate and Rhythm: Normal rate and regular rhythm.     Heart sounds: No murmur heard. No friction rub. No gallop.   Pulmonary:     Effort: Pulmonary effort is normal. No respiratory distress.     Breath sounds: Normal breath sounds. No wheezing, rhonchi or rales.  Chest:  Breasts:     Right: No axillary adenopathy or supraclavicular adenopathy.     Left: No axillary adenopathy or supraclavicular adenopathy.    Abdominal:     General: Bowel sounds are normal. There is no  distension.     Palpations: Abdomen is soft. There is no mass.     Tenderness: There is no abdominal tenderness.  Musculoskeletal:        General: No swelling.     Right lower leg: No edema.     Left lower leg: No edema.  Lymphadenopathy:     Cervical: No cervical adenopathy.     Upper Body:     Right upper body: No supraclavicular or axillary adenopathy.     Left upper body: No supraclavicular or axillary adenopathy.     Lower Body: No right inguinal adenopathy. No left inguinal adenopathy.  Skin:    General: Skin is warm.     Coloration: Skin is not jaundiced.     Findings: No lesion or rash.  Neurological:     General: No focal deficit present.     Mental Status: She is alert and oriented to person, place, and time. Mental status is at baseline.     Cranial Nerves: Cranial nerves are intact.  Psychiatric:        Mood and Affect: Mood normal.        Behavior: Behavior normal.        Thought Content: Thought content normal.    LABS:   CBC Latest Ref Rng & Units 02/17/2021 08/19/2020 07/22/2020  WBC - 7.0 7.9  9.1  Hemoglobin 12.0 - 16.0 12.7 12.8 12.9  Hematocrit 36 - 46 38 38 40.6  Platelets 150 - 399 224 251 275   CMP Latest Ref Rng & Units 07/22/2020  Glucose 70 - 99 mg/dL 90  BUN 8 - 23 mg/dL 18  Creatinine 0.44 - 1.00 mg/dL 1.15(H)  Sodium 135 - 145 mmol/L 140  Potassium 3.5 - 5.1 mmol/L 4.7  Chloride 98 - 111 mmol/L 106  CO2 22 - 32 mmol/L 24  Calcium 8.9 - 10.3 mg/dL 9.5  Total Protein 6.5 - 8.1 g/dL 7.0  Total Bilirubin 0.3 - 1.2 mg/dL 0.3  Alkaline Phos 38 - 126 U/L 53  AST 15 - 41 U/L 22  ALT 0 - 44 U/L 14    Ref. Range 02/17/2021 10:25  Iron Latest Ref Range: 28 - 170 ug/dL 219 (H)  UIBC Latest Units: ug/dL 125  TIBC Latest Ref Range: 250 - 450 ug/dL 344  Saturation Ratios Latest Ref Range: 10.4 - 31.8 % 64 (H)  Ferritin Latest Ref Range: 11 - 307 ng/mL 18    ASSESSMENT & PLAN:   Assessment/Plan:  An 87 y.o. 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  I am pleased as her hemoglobin has held at an ideal level.  Of note, she is taking 1 iron pill daily to prevent recurrent iron deficiency from redeveloping, which I have no problem with her doing.  Clinically, she appears to be doing well.  As that is the case, I will see her back in another 6 months for repeat clinical assessment.  .The patient understands all the plans discussed today and is in agreement with them.    Ijeoma Loor Macarthur Critchley, MD

## 2021-02-17 ENCOUNTER — Inpatient Hospital Stay: Payer: Medicare Other | Attending: Oncology

## 2021-02-17 ENCOUNTER — Other Ambulatory Visit: Payer: Self-pay | Admitting: Oncology

## 2021-02-17 ENCOUNTER — Other Ambulatory Visit: Payer: Self-pay

## 2021-02-17 ENCOUNTER — Inpatient Hospital Stay (INDEPENDENT_AMBULATORY_CARE_PROVIDER_SITE_OTHER): Payer: Medicare Other | Admitting: Oncology

## 2021-02-17 ENCOUNTER — Other Ambulatory Visit: Payer: Self-pay | Admitting: Hematology and Oncology

## 2021-02-17 ENCOUNTER — Telehealth: Payer: Self-pay | Admitting: Oncology

## 2021-02-17 DIAGNOSIS — D509 Iron deficiency anemia, unspecified: Secondary | ICD-10-CM | POA: Diagnosis not present

## 2021-02-17 DIAGNOSIS — D500 Iron deficiency anemia secondary to blood loss (chronic): Secondary | ICD-10-CM

## 2021-02-17 DIAGNOSIS — D649 Anemia, unspecified: Secondary | ICD-10-CM | POA: Diagnosis not present

## 2021-02-17 LAB — IRON AND TIBC
Iron: 219 ug/dL — ABNORMAL HIGH (ref 28–170)
Saturation Ratios: 64 % — ABNORMAL HIGH (ref 10.4–31.8)
TIBC: 344 ug/dL (ref 250–450)
UIBC: 125 ug/dL

## 2021-02-17 LAB — CBC AND DIFFERENTIAL
HCT: 38 (ref 36–46)
Hemoglobin: 12.7 (ref 12.0–16.0)
Neutrophils Absolute: 4.13
Platelets: 224 (ref 150–399)
WBC: 7

## 2021-02-17 LAB — CBC
MCV: 98 (ref 81–99)
RBC: 3.84 — AB (ref 3.87–5.11)

## 2021-02-17 LAB — FERRITIN: Ferritin: 18 ng/mL (ref 11–307)

## 2021-02-17 NOTE — Telephone Encounter (Signed)
Per 4/6 los next appt scheduled and given to patient 

## 2021-03-03 DIAGNOSIS — D500 Iron deficiency anemia secondary to blood loss (chronic): Secondary | ICD-10-CM | POA: Insufficient documentation

## 2021-04-05 IMAGING — MR MR HEAD WO/W CM
12 series · 48 of 48 positions shown · IV contrast (18ml Multihance)
Comparison: Head CT July 22, 2020.

CLINICAL DATA: History of stroke.

EXAM:
MRI HEAD WITHOUT AND WITH CONTRAST
TECHNIQUE: Multiplanar, multiecho pulse sequences of the brain and surrounding
structures were obtained without and with intravenous contrast.
CONTRAST:  18mL MULTIHANCE GADOBENATE DIMEGLUMINE 529 MG/ML IV SOLN

[Series 2: t1_se_sag · sagittal · 5.0mm · 0.45mm/px · 1 of 23 slices shown]
[im 1/23]
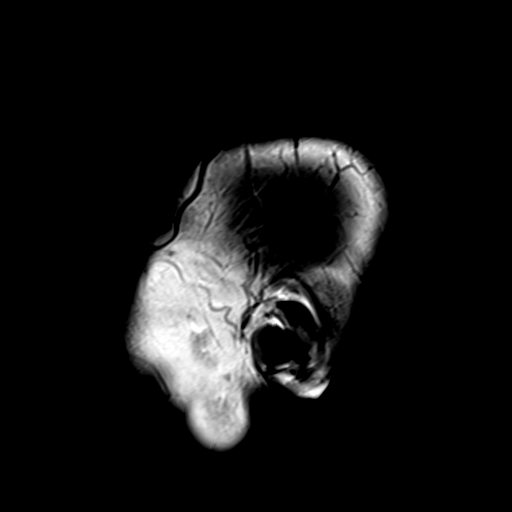

[Series 3: ep2d_diff_3 · axial · 3.0mm · 1.80mm/px · z∈[-18,+122]mm · 5 of 91 slices shown]
[im 1/91]
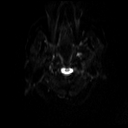
[im 23/91]
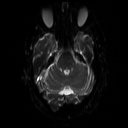
[im 46/91]
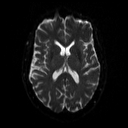
[im 68/91]
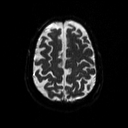
[im 91/91]
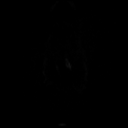

[Series 4: ep2d_diff_3_adc · axial · 3.0mm · 1.80mm/px · z∈[-18,+122]mm · 3 of 47 slices shown]
[im 1/47]
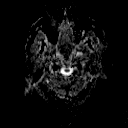
[im 24/47]
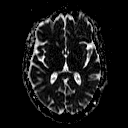
[im 47/47]
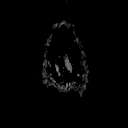

[Series 5: ep2d_diff_cor · coronal · 5.0mm · 1.77mm/px · 4 of 54 slices shown]
[im 1/54]
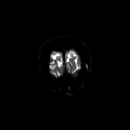
[im 18/54]
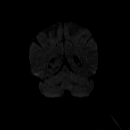
[im 36/54]
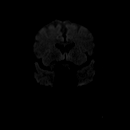
[im 54/54]
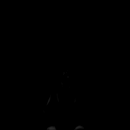

[Series 6: ep2d_diff_cor_adc · coronal · 5.0mm · 1.77mm/px · 2 of 27 slices shown]
[im 1/27]
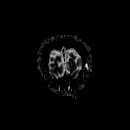
[im 27/27]
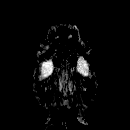

[Series 8: swi_images · axial · 2.0mm · 0.90mm/px · z∈[-18,+122]mm · 5 of 72 slices shown]
[im 1/72]
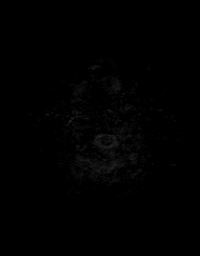
[im 18/72]
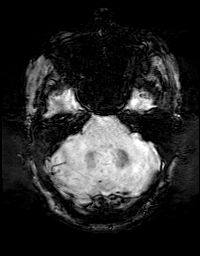
[im 36/72]
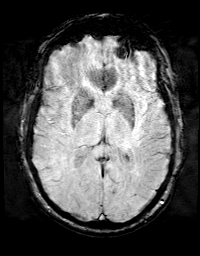
[im 54/72]
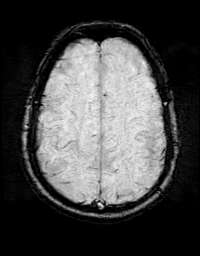
[im 72/72]
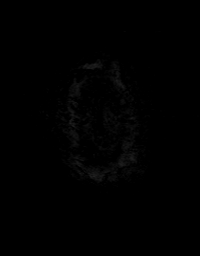

[Series 9: FLAIR · axial · 3.0mm · 0.43mm/px · z∈[-20,+123]mm · 2 of 25 slices shown]
[im 1/25]
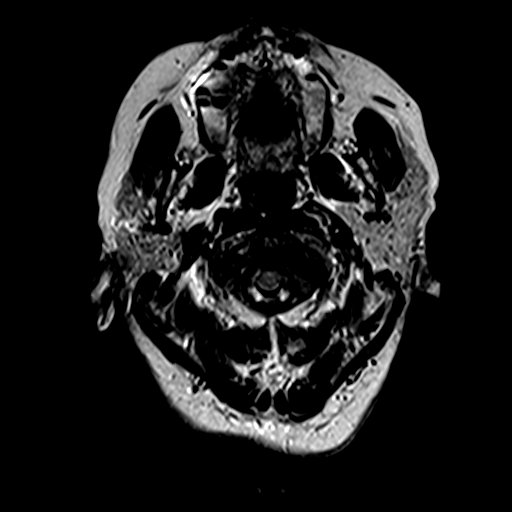
[im 25/25]
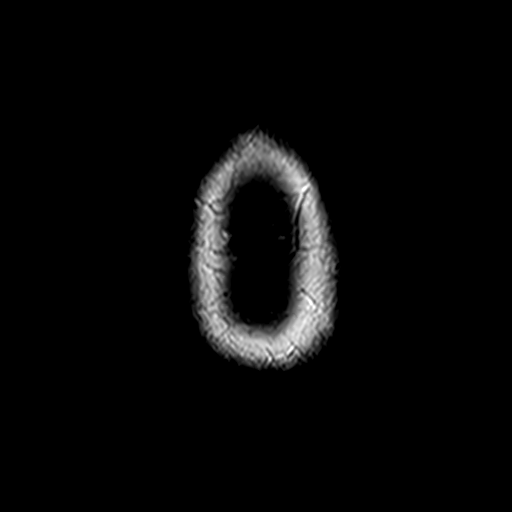

[Series 10: t2_tse_tra_512 · axial · 5.0mm · 0.72mm/px · z∈[-16,+120]mm · 2 of 24 slices shown]
[im 1/24]
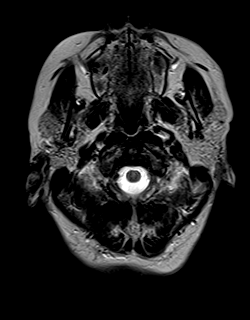
[im 24/24]
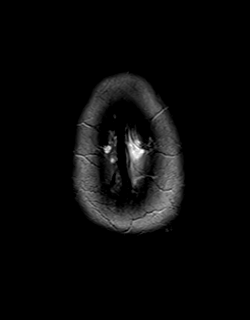

[Series 11: t1_mpr_tra · axial · 1.0mm · 0.72mm/px · z∈[-19,+123]mm · 10 of 144 slices shown (1 of 2)]
[im 1/144]
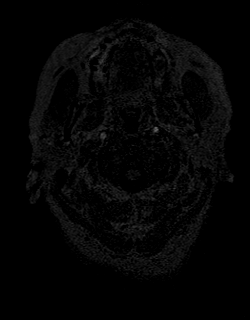
[im 16/144]
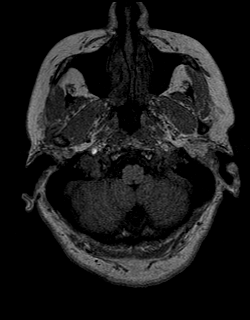
[im 32/144]
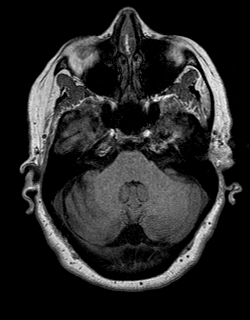
[im 48/144]
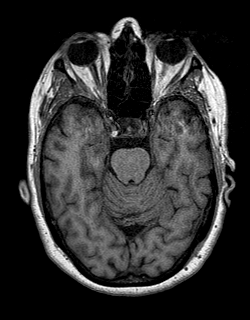
[im 64/144]
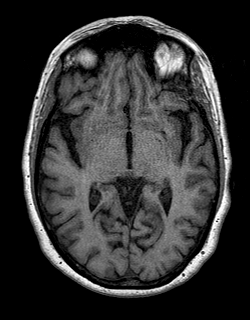
[im 80/144]
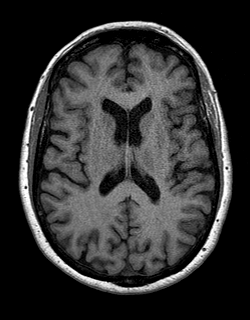
[im 96/144]
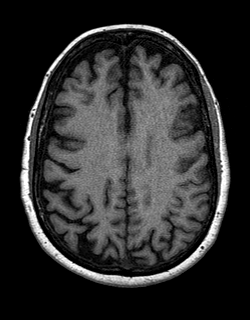
[im 112/144]
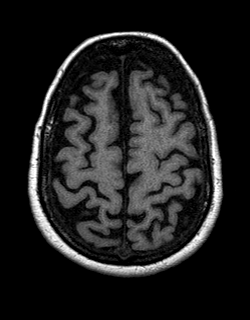
[im 128/144]
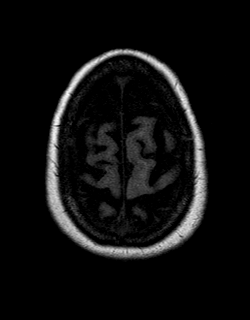
[im 144/144]
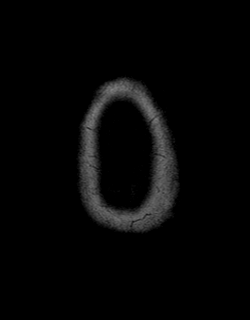

[Series 12: T2 · coronal · 5.0mm · 0.45mm/px · 2 of 28 slices shown]
[im 1/28]
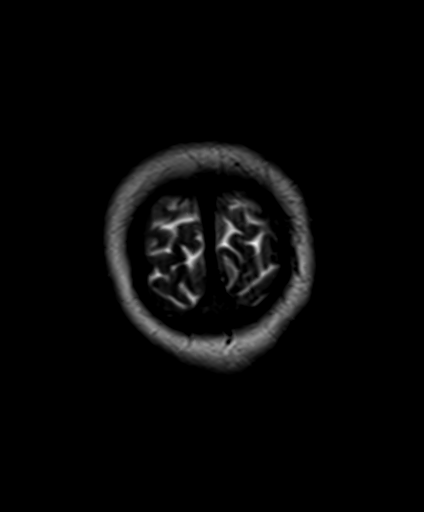
[im 28/28]
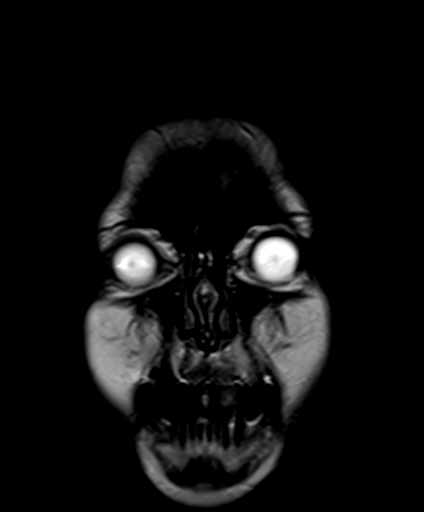

[Series 13: t1_mpr_tra · axial · 1.0mm · 0.72mm/px · z∈[-19,+123]mm · 10 of 144 slices shown (2 of 2)]
[im 1/144]
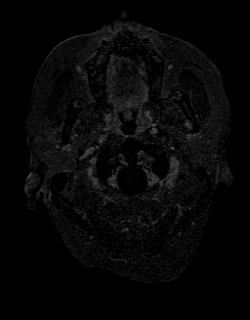
[im 16/144]
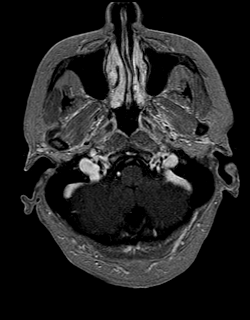
[im 32/144]
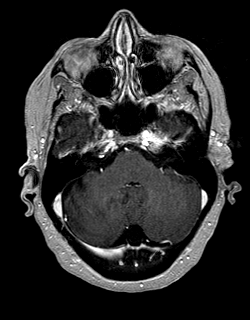
[im 48/144]
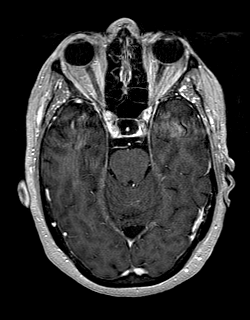
[im 64/144]
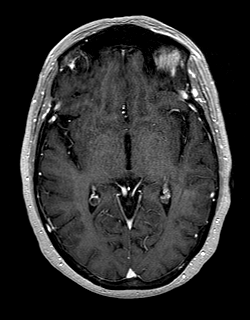
[im 80/144]
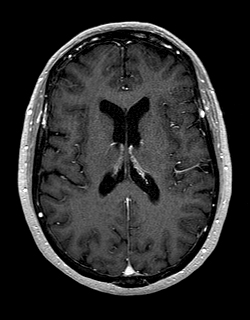
[im 96/144]
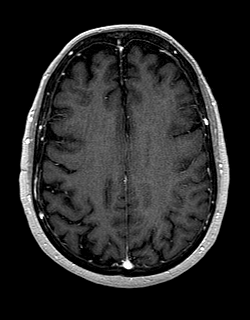
[im 112/144]
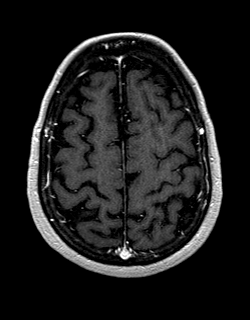
[im 128/144]
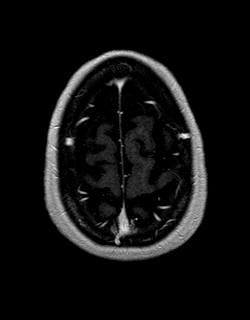
[im 144/144]
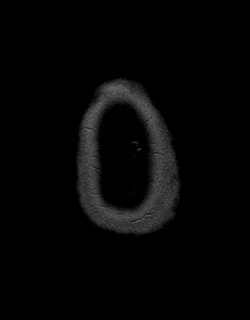

[Series 14: T1 post-contrast · coronal · 5.0mm · 0.90mm/px · 2 of 26 slices shown]
[im 1/26]
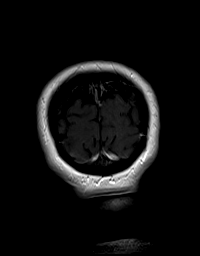
[im 26/26]
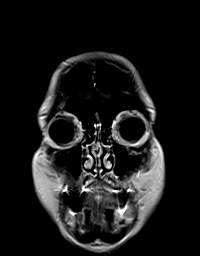

[48 of 48 positions shown; findings below may reference images not displayed]

FINDINGS: Brain: No acute infarction, hemorrhage, hydrocephalus, extra-axial
collection or mass lesion. Susceptibility artifact in the left
occipital lobe sulci, suggestive of hemosiderin deposits. Scattered
foci T2 hyperintensity are seen within the white matter of the
cerebral hemispheres, nonspecific, most likely related to chronic
small vessel ischemia. Mild diffuse parenchymal volume loss. No
focus of abnormal contrast enhancement.

Vascular: Normal flow voids. Hypoplastic vertebrobasilar system with
bilateral fetal PCAs.

Skull and upper cervical spine: Postsurgical changes in the
visualized upper cervical spine. No focal marrow abnormality.

Sinuses/Orbits: Bilateral lens surgery. Paranasal sinuses are clear.
IMPRESSION: 1. No acute intracranial abnormality.
2. Mild diffuse parenchymal volume loss and chronic small vessel
ischemia.
3. Hemosiderin deposit in the left occipital lobe sulci.

## 2021-05-05 DIAGNOSIS — I10 Essential (primary) hypertension: Secondary | ICD-10-CM | POA: Diagnosis not present

## 2021-05-05 DIAGNOSIS — E559 Vitamin D deficiency, unspecified: Secondary | ICD-10-CM | POA: Diagnosis not present

## 2021-05-05 DIAGNOSIS — E782 Mixed hyperlipidemia: Secondary | ICD-10-CM | POA: Diagnosis not present

## 2021-05-05 DIAGNOSIS — Z6834 Body mass index (BMI) 34.0-34.9, adult: Secondary | ICD-10-CM | POA: Diagnosis not present

## 2021-05-05 DIAGNOSIS — E039 Hypothyroidism, unspecified: Secondary | ICD-10-CM | POA: Diagnosis not present

## 2021-05-20 DIAGNOSIS — Z9181 History of falling: Secondary | ICD-10-CM | POA: Diagnosis not present

## 2021-05-20 DIAGNOSIS — Z1331 Encounter for screening for depression: Secondary | ICD-10-CM | POA: Diagnosis not present

## 2021-05-20 DIAGNOSIS — Z6834 Body mass index (BMI) 34.0-34.9, adult: Secondary | ICD-10-CM | POA: Diagnosis not present

## 2021-05-20 DIAGNOSIS — M5430 Sciatica, unspecified side: Secondary | ICD-10-CM | POA: Diagnosis not present

## 2021-05-26 DIAGNOSIS — K625 Hemorrhage of anus and rectum: Secondary | ICD-10-CM | POA: Diagnosis not present

## 2021-05-26 DIAGNOSIS — D509 Iron deficiency anemia, unspecified: Secondary | ICD-10-CM | POA: Diagnosis not present

## 2021-05-26 DIAGNOSIS — K649 Unspecified hemorrhoids: Secondary | ICD-10-CM | POA: Diagnosis not present

## 2021-05-26 DIAGNOSIS — K7689 Other specified diseases of liver: Secondary | ICD-10-CM | POA: Diagnosis not present

## 2021-08-12 NOTE — Progress Notes (Signed)
[facility]  [address] [place],  [place]  [address] [phone]  Clinic Day:  08/19/2021  Referring physician: Angelina Sheriff, MD  This document serves as a record of services personally performed by Geneen Dieter Macarthur Critchley, MD. It was created on their behalf by [facility], a trained medical scribe. The creation of this record is based on the scribe's personal observations and the provider's statements to them.  HISTORY OF PRESENT ILLNESS:  The patient is an 88 y.o. female with iron deficiency anemia.  In the past, IV Feraheme was necessary to replenish her iron stores and improve her hemoglobin.  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  She comes in today for routine follow up.  Since her last visit, the patient has been doing fairly well.  She denies having increased fatigue, any recent rectal bleeding or other overt forms of blood loss over these past months which concern her for recurrent iron deficiency anemia.  She continues to take oral iron on a daily basis.   PHYSICAL EXAM:  Blood pressure (!) 182/74, pulse 77, temperature 98.6 F (37 C), resp. rate 16, height 5\' 4"  (1.626 m), weight 203 lb 4.8 oz (92.2 kg), SpO2 97 %. Wt Readings from Last 3 Encounters:  08/19/21 203 lb 4.8 oz (92.2 kg)  02/17/21 200 lb 9.6 oz (91 kg)  11/09/20 198 lb 9.6 oz (90.1 kg)   Body mass index is 34.9 kg/m. Performance status (ECOG): 1 Physical Exam Constitutional:      Appearance: Normal appearance. She is not ill-appearing.  HENT:     Mouth/Throat:     Mouth: Mucous membranes are moist.     Pharynx: Oropharynx is clear. No oropharyngeal exudate or posterior oropharyngeal erythema.  Cardiovascular:     Rate and Rhythm: Normal rate and regular rhythm.     Heart sounds: No murmur heard.   No friction rub. No gallop.  Pulmonary:     Effort: Pulmonary effort is normal. No respiratory distress.     Breath sounds:  Normal breath sounds. No wheezing, rhonchi or rales.  Abdominal:     General: Bowel sounds are normal. There is no distension.     Palpations: Abdomen is soft. There is no mass.     Tenderness: There is no abdominal tenderness.  Musculoskeletal:        General: No swelling.     Right lower leg: No edema.     Left lower leg: No edema.  Lymphadenopathy:     Cervical: No cervical adenopathy.     Upper Body:     Right upper body: No supraclavicular or axillary adenopathy.     Left upper body: No supraclavicular or axillary adenopathy.     Lower Body: No right inguinal adenopathy. No left inguinal adenopathy.  Skin:    General: Skin is warm.     Coloration: Skin is not jaundiced.     Findings: No lesion or rash.  Neurological:     General: No focal deficit present.     Mental Status: She is alert and oriented to person, place, and time. Mental status is at baseline.     Cranial Nerves: Cranial nerves are intact.  Psychiatric:        Mood and Affect: Mood normal.        Behavior: Behavior normal.        Thought Content: Thought content normal.   LABS:   CBC Latest  Ref Rng & Units 08/19/2021 02/17/2021 08/19/2020  WBC - 7.9 7.0 7.9  Hemoglobin 12.0 - 16.0 11.8(A) 12.7 12.8  Hematocrit 36 - 46 35(A) 38 38  Platelets 150 - 399 248 224 251    Ref. Range 08/19/2021 13:50  Iron Latest Ref Range: 28 - 170 ug/dL 270 (H)  UIBC Latest Units: ug/dL 130  TIBC Latest Ref Range: 250 - 450 ug/dL 400  Saturation Ratios Latest Ref Range: 10.4 - 31.8 % 68 (H)  Ferritin Latest Ref Range: 11 - 307 ng/mL 16   ASSESSMENT & PLAN:  Assessment/Plan:  An 87 y.o. 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  Although her hemoglobin has fallen since her last visit, it is only minimally low.  Of note, she is taking 1 iron pill daily to prevent recurrent iron deficiency from redeveloping, which I have no problem with her continuing to do.  Clinically, she appears to be doing well.  As  that is the case, I will see her back in another 6 months for repeat clinical assessment.  The patient understands all the plans discussed today and is in agreement with them.    I, Rita Ohara, am acting as scribe for Marice Potter, MD    I have reviewed this report as typed by the medical scribe, and it is complete and accurate.  Karren Newland Macarthur Critchley, MD

## 2021-08-19 ENCOUNTER — Other Ambulatory Visit: Payer: Self-pay

## 2021-08-19 ENCOUNTER — Other Ambulatory Visit: Payer: Self-pay | Admitting: Hematology and Oncology

## 2021-08-19 ENCOUNTER — Inpatient Hospital Stay: Payer: Medicare Other | Attending: Oncology | Admitting: Oncology

## 2021-08-19 ENCOUNTER — Inpatient Hospital Stay: Payer: Medicare Other

## 2021-08-19 VITALS — BP 182/74 | HR 77 | Temp 98.6°F | Resp 16 | Ht 64.0 in | Wt 203.3 lb

## 2021-08-19 DIAGNOSIS — D500 Iron deficiency anemia secondary to blood loss (chronic): Secondary | ICD-10-CM

## 2021-08-19 DIAGNOSIS — D509 Iron deficiency anemia, unspecified: Secondary | ICD-10-CM | POA: Insufficient documentation

## 2021-08-19 DIAGNOSIS — D649 Anemia, unspecified: Secondary | ICD-10-CM | POA: Diagnosis not present

## 2021-08-19 LAB — CBC AND DIFFERENTIAL
HCT: 35 — AB (ref 36–46)
Hemoglobin: 11.8 — AB (ref 12.0–16.0)
Neutrophils Absolute: 5.06
Platelets: 248 (ref 150–399)
WBC: 7.9

## 2021-08-19 LAB — IRON AND TIBC
Iron: 270 ug/dL — ABNORMAL HIGH (ref 28–170)
Saturation Ratios: 68 % — ABNORMAL HIGH (ref 10.4–31.8)
TIBC: 400 ug/dL (ref 250–450)
UIBC: 130 ug/dL

## 2021-08-19 LAB — FERRITIN: Ferritin: 16 ng/mL (ref 11–307)

## 2021-08-19 LAB — CBC: RBC: 3.63 — AB (ref 3.87–5.11)

## 2021-08-31 DIAGNOSIS — I10 Essential (primary) hypertension: Secondary | ICD-10-CM | POA: Diagnosis not present

## 2021-08-31 DIAGNOSIS — R600 Localized edema: Secondary | ICD-10-CM | POA: Diagnosis not present

## 2021-08-31 DIAGNOSIS — N390 Urinary tract infection, site not specified: Secondary | ICD-10-CM | POA: Diagnosis not present

## 2021-08-31 DIAGNOSIS — Z6834 Body mass index (BMI) 34.0-34.9, adult: Secondary | ICD-10-CM | POA: Diagnosis not present

## 2021-10-26 DIAGNOSIS — Z0000 Encounter for general adult medical examination without abnormal findings: Secondary | ICD-10-CM | POA: Diagnosis not present

## 2021-10-26 DIAGNOSIS — I10 Essential (primary) hypertension: Secondary | ICD-10-CM | POA: Diagnosis not present

## 2021-10-26 DIAGNOSIS — Z6835 Body mass index (BMI) 35.0-35.9, adult: Secondary | ICD-10-CM | POA: Diagnosis not present

## 2021-10-26 DIAGNOSIS — Z23 Encounter for immunization: Secondary | ICD-10-CM | POA: Diagnosis not present

## 2021-10-26 DIAGNOSIS — D510 Vitamin B12 deficiency anemia due to intrinsic factor deficiency: Secondary | ICD-10-CM | POA: Diagnosis not present

## 2021-11-04 DIAGNOSIS — Z20828 Contact with and (suspected) exposure to other viral communicable diseases: Secondary | ICD-10-CM | POA: Diagnosis not present

## 2021-11-04 DIAGNOSIS — Z6835 Body mass index (BMI) 35.0-35.9, adult: Secondary | ICD-10-CM | POA: Diagnosis not present

## 2021-11-04 DIAGNOSIS — J40 Bronchitis, not specified as acute or chronic: Secondary | ICD-10-CM | POA: Diagnosis not present

## 2021-11-04 DIAGNOSIS — J329 Chronic sinusitis, unspecified: Secondary | ICD-10-CM | POA: Diagnosis not present

## 2021-12-14 NOTE — Progress Notes (Signed)
[facility]  [address] [place],  [place]  [address] [phone]  Clinic Day:  12/20/2021  Referring physician: Angelina Sheriff, MD  This document serves as a record of services personally performed by Alexis Posch Macarthur Critchley, MD. It was created on their behalf by [facility] E, a trained medical scribe. The creation of this record is based on the scribe's personal observations and the provider's statements to them.  HISTORY OF PRESENT ILLNESS:  The patient is an 88 y.o. female with iron deficiency anemia.  In the past, IV Feraheme was necessary to replenish her iron stores and improve her hemoglobin.  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  She comes in today for routine follow up.  Since her last visit, the patient has been doing well.  She denies having increased fatigue, any recent rectal bleeding or other overt forms of blood loss over these past months which concern her for recurrent iron deficiency anemia.  She continues to take 1 iron pill on a daily basis.   PHYSICAL EXAM:  Blood pressure (!) 195/84, pulse 77, temperature 98.5 F (36.9 C), resp. rate 16, height 5\' 4"  (1.626 m), weight 205 lb (93 kg), SpO2 96 %. Wt Readings from Last 3 Encounters:  12/20/21 205 lb (93 kg)  08/19/21 203 lb 4.8 oz (92.2 kg)  02/17/21 200 lb 9.6 oz (91 kg)   Body mass index is 35.19 kg/m. Performance status (ECOG): 1 Physical Exam Constitutional:      Appearance: Normal appearance. She is not ill-appearing.  HENT:     Mouth/Throat:     Mouth: Mucous membranes are moist.     Pharynx: Oropharynx is clear. No oropharyngeal exudate or posterior oropharyngeal erythema.  Cardiovascular:     Rate and Rhythm: Normal rate and regular rhythm.     Heart sounds: No murmur heard.   No friction rub. No gallop.  Pulmonary:     Effort: Pulmonary effort is normal. No respiratory distress.     Breath sounds: Normal breath  sounds. No wheezing, rhonchi or rales.  Abdominal:     General: Bowel sounds are normal. There is no distension.     Palpations: Abdomen is soft. There is no mass.     Tenderness: There is no abdominal tenderness.  Musculoskeletal:        General: No swelling.     Right lower leg: No edema.     Left lower leg: No edema.  Lymphadenopathy:     Cervical: No cervical adenopathy.     Upper Body:     Right upper body: No supraclavicular or axillary adenopathy.     Left upper body: No supraclavicular or axillary adenopathy.     Lower Body: No right inguinal adenopathy. No left inguinal adenopathy.  Skin:    General: Skin is warm.     Coloration: Skin is not jaundiced.     Findings: No lesion or rash.  Neurological:     General: No focal deficit present.     Mental Status: She is alert and oriented to person, place, and time. Mental status is at baseline.  Psychiatric:        Mood and Affect: Mood normal.        Behavior: Behavior normal.        Thought Content: Thought content normal.   LABS:   CBC Latest Ref Rng & Units 12/20/2021 08/19/2021 02/17/2021  WBC - 7.5 7.9 7.0  Hemoglobin 12.0 - 16.0 12.4 11.8(A) 12.7  Hematocrit 36 - 46 36 35(A) 38  Platelets 150 - 399 236 248 224    Latest Reference Range & Units 12/20/21 10:34  Iron 28 - 170 ug/dL 223 (H)  UIBC ug/dL 155  TIBC 250 - 450 ug/dL 378  Saturation Ratios 10.4 - 31.8 % 59 (H)  Ferritin 11 - 307 ng/mL 16  (H): Data is abnormally high  ASSESSMENT & PLAN:  Assessment/Plan:  An 88 y.o. 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  I am pleased her hemoglobin is better than what it was at her last visit.  Her iron problems today also look fine.  I have no problem with her continuing to take 1 iron pill on a daily basis.  Clinically, she appears to be doing well.  As that is the case, I will see her back in another 6 months for repeat clinical assessment.  The patient understands all the plans discussed  today and is in agreement with them.    I, Rita Ohara, am acting as scribe for Marice Potter, MD    I have reviewed this report as typed by the medical scribe, and it is complete and accurate.  Alexis Omura Macarthur Critchley, MD

## 2021-12-20 ENCOUNTER — Inpatient Hospital Stay: Payer: Medicare Other | Attending: Oncology | Admitting: Oncology

## 2021-12-20 ENCOUNTER — Other Ambulatory Visit: Payer: Self-pay | Admitting: Oncology

## 2021-12-20 ENCOUNTER — Telehealth: Payer: Self-pay | Admitting: Oncology

## 2021-12-20 ENCOUNTER — Other Ambulatory Visit: Payer: Self-pay

## 2021-12-20 ENCOUNTER — Inpatient Hospital Stay: Payer: Medicare Other

## 2021-12-20 ENCOUNTER — Encounter: Payer: Self-pay | Admitting: Oncology

## 2021-12-20 VITALS — BP 195/84 | HR 77 | Temp 98.5°F | Resp 16 | Ht 64.0 in | Wt 205.0 lb

## 2021-12-20 DIAGNOSIS — D500 Iron deficiency anemia secondary to blood loss (chronic): Secondary | ICD-10-CM

## 2021-12-20 DIAGNOSIS — Z79899 Other long term (current) drug therapy: Secondary | ICD-10-CM | POA: Insufficient documentation

## 2021-12-20 DIAGNOSIS — D509 Iron deficiency anemia, unspecified: Secondary | ICD-10-CM | POA: Diagnosis not present

## 2021-12-20 LAB — IRON AND TIBC
Iron: 223 ug/dL — ABNORMAL HIGH (ref 28–170)
Saturation Ratios: 59 % — ABNORMAL HIGH (ref 10.4–31.8)
TIBC: 378 ug/dL (ref 250–450)
UIBC: 155 ug/dL

## 2021-12-20 LAB — CBC AND DIFFERENTIAL
HCT: 36 (ref 36–46)
Hemoglobin: 12.4 (ref 12.0–16.0)
Neutrophils Absolute: 4.65
Platelets: 236 (ref 150–399)
WBC: 7.5

## 2021-12-20 LAB — FERRITIN: Ferritin: 16 ng/mL (ref 11–307)

## 2021-12-20 LAB — CBC: RBC: 3.73 — AB (ref 3.87–5.11)

## 2021-12-20 NOTE — Telephone Encounter (Signed)
Patient has been scheduled for follow-up visit per 12/20/21 los. Pt given an appt calendar with date and time.

## 2022-02-25 DIAGNOSIS — Z01812 Encounter for preprocedural laboratory examination: Secondary | ICD-10-CM | POA: Diagnosis not present

## 2022-02-28 DIAGNOSIS — K5730 Diverticulosis of large intestine without perforation or abscess without bleeding: Secondary | ICD-10-CM | POA: Diagnosis not present

## 2022-02-28 DIAGNOSIS — N200 Calculus of kidney: Secondary | ICD-10-CM | POA: Diagnosis not present

## 2022-02-28 DIAGNOSIS — I700 Atherosclerosis of aorta: Secondary | ICD-10-CM | POA: Diagnosis not present

## 2022-02-28 DIAGNOSIS — K7689 Other specified diseases of liver: Secondary | ICD-10-CM | POA: Diagnosis not present

## 2022-03-16 ENCOUNTER — Other Ambulatory Visit: Payer: Self-pay | Admitting: Specialist

## 2022-03-16 ENCOUNTER — Encounter: Payer: Self-pay | Admitting: Specialist

## 2022-06-07 DIAGNOSIS — I10 Essential (primary) hypertension: Secondary | ICD-10-CM | POA: Diagnosis not present

## 2022-06-07 DIAGNOSIS — E559 Vitamin D deficiency, unspecified: Secondary | ICD-10-CM | POA: Diagnosis not present

## 2022-06-07 DIAGNOSIS — M8580 Other specified disorders of bone density and structure, unspecified site: Secondary | ICD-10-CM | POA: Diagnosis not present

## 2022-06-07 DIAGNOSIS — E039 Hypothyroidism, unspecified: Secondary | ICD-10-CM | POA: Diagnosis not present

## 2022-06-07 DIAGNOSIS — E782 Mixed hyperlipidemia: Secondary | ICD-10-CM | POA: Diagnosis not present

## 2022-06-07 DIAGNOSIS — Z6835 Body mass index (BMI) 35.0-35.9, adult: Secondary | ICD-10-CM | POA: Diagnosis not present

## 2022-06-07 DIAGNOSIS — Z79899 Other long term (current) drug therapy: Secondary | ICD-10-CM | POA: Diagnosis not present

## 2022-06-20 ENCOUNTER — Other Ambulatory Visit: Payer: Medicare Other

## 2022-06-20 ENCOUNTER — Ambulatory Visit: Payer: Medicare Other | Admitting: Oncology

## 2022-06-23 NOTE — Progress Notes (Signed)
[facility]  [address] [place],  [place]  [address] [phone]  Clinic Day:  06/24/2022  Referring physician: Angelina Sheriff, MD  HISTORY OF PRESENT ILLNESS:  The patient is an 88 y.o. female with iron deficiency anemia secondary to GI arteriovenous malformations.  In the past, IV Feraheme was necessary to replenish her iron stores and improve her hemoglobin.  She comes in today for routine follow up.  Since her last visit, the patient has been doing well.  She denies having increased fatigue or other overt forms of blood loss which concern her for recurrent iron deficiency anemia.  She continues to take 1 iron pill on a daily basis.   PHYSICAL EXAM:  Blood pressure (!) 149/67, pulse 81, temperature 98.4 F (36.9 C), resp. rate 16, height '5\' 4"'$  (1.626 m), weight 204 lb 14.4 oz (92.9 kg), SpO2 96 %. Wt Readings from Last 3 Encounters:  06/24/22 204 lb 14.4 oz (92.9 kg)  12/20/21 205 lb (93 kg)  08/19/21 203 lb 4.8 oz (92.2 kg)   Body mass index is 35.17 kg/m. Performance status (ECOG): 1 Physical Exam Constitutional:      Appearance: Normal appearance. She is not ill-appearing.  HENT:     Mouth/Throat:     Mouth: Mucous membranes are moist.     Pharynx: Oropharynx is clear. No oropharyngeal exudate or posterior oropharyngeal erythema.  Cardiovascular:     Rate and Rhythm: Normal rate and regular rhythm.     Heart sounds: No murmur heard.    No friction rub. No gallop.  Pulmonary:     Effort: Pulmonary effort is normal. No respiratory distress.     Breath sounds: Normal breath sounds. No wheezing, rhonchi or rales.  Abdominal:     General: Bowel sounds are normal. There is no distension.     Palpations: Abdomen is soft. There is no mass.     Tenderness: There is no abdominal tenderness.  Musculoskeletal:        General: No swelling.     Right lower leg: No edema.     Left lower leg: No edema.  Lymphadenopathy:     Cervical: No  cervical adenopathy.     Upper Body:     Right upper body: No supraclavicular or axillary adenopathy.     Left upper body: No supraclavicular or axillary adenopathy.     Lower Body: No right inguinal adenopathy. No left inguinal adenopathy.  Skin:    General: Skin is warm.     Coloration: Skin is not jaundiced.     Findings: No lesion or rash.  Neurological:     General: No focal deficit present.     Mental Status: She is alert and oriented to person, place, and time. Mental status is at baseline.  Psychiatric:        Mood and Affect: Mood normal.        Behavior: Behavior normal.        Thought Content: Thought content normal.    LABS:      Latest Ref Rng & Units 06/24/2022   12:00 AM 12/20/2021   12:00 AM 08/19/2021   12:00 AM  CBC  WBC  8.8     7.5  7.9      Hemoglobin 12.0 - 16.0 12.2     12.4  11.8      Hematocrit 36 - 46 36     36  35      Platelets  150 - 400 K/uL 224     236  248         This result is from an external source.    Latest Reference Range & Units 06/24/22 11:22  Iron 28 - 170 ug/dL 153  UIBC ug/dL 201  TIBC 250 - 450 ug/dL 354  Saturation Ratios 10.4 - 31.8 % 43 (H)  Ferritin 11 - 307 ng/mL 13  (H): Data is abnormally high for her to  ASSESSMENT & PLAN:  Assessment/Plan:  An 88 y.o. 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  I am pleased her hemoglobin and iron parameters have essentially held stable over these past months without any particular intervention.  I have no problem with her continuing to take 1 iron pill on a daily basis.  Clinically, she appears to be doing well.  I will see her back in another 6 months for repeat clinical assessment.  The patient understands all the plans discussed today and is in agreement with them.    Fred Franzen Macarthur Critchley, MD

## 2022-06-24 ENCOUNTER — Inpatient Hospital Stay (INDEPENDENT_AMBULATORY_CARE_PROVIDER_SITE_OTHER): Payer: Medicare Other | Admitting: Oncology

## 2022-06-24 ENCOUNTER — Inpatient Hospital Stay: Payer: Medicare Other | Attending: Oncology

## 2022-06-24 ENCOUNTER — Other Ambulatory Visit: Payer: Self-pay | Admitting: Oncology

## 2022-06-24 VITALS — BP 149/67 | HR 81 | Temp 98.4°F | Resp 16 | Ht 64.0 in | Wt 204.9 lb

## 2022-06-24 DIAGNOSIS — D500 Iron deficiency anemia secondary to blood loss (chronic): Secondary | ICD-10-CM

## 2022-06-24 DIAGNOSIS — Q2733 Arteriovenous malformation of digestive system vessel: Secondary | ICD-10-CM | POA: Insufficient documentation

## 2022-06-24 LAB — CBC AND DIFFERENTIAL
HCT: 36 (ref 36–46)
Hemoglobin: 12.2 (ref 12.0–16.0)
Neutrophils Absolute: 5.46
Platelets: 224 10*3/uL (ref 150–400)
WBC: 8.8

## 2022-06-24 LAB — FERRITIN: Ferritin: 13 ng/mL (ref 11–307)

## 2022-06-24 LAB — IRON AND TIBC
Iron: 153 ug/dL (ref 28–170)
Saturation Ratios: 43 % — ABNORMAL HIGH (ref 10.4–31.8)
TIBC: 354 ug/dL (ref 250–450)
UIBC: 201 ug/dL

## 2022-06-24 LAB — CBC: RBC: 3.67 — AB (ref 3.87–5.11)

## 2022-12-06 DIAGNOSIS — E039 Hypothyroidism, unspecified: Secondary | ICD-10-CM | POA: Diagnosis not present

## 2022-12-06 DIAGNOSIS — Z0000 Encounter for general adult medical examination without abnormal findings: Secondary | ICD-10-CM | POA: Diagnosis not present

## 2022-12-06 DIAGNOSIS — E782 Mixed hyperlipidemia: Secondary | ICD-10-CM | POA: Diagnosis not present

## 2022-12-06 DIAGNOSIS — I10 Essential (primary) hypertension: Secondary | ICD-10-CM | POA: Diagnosis not present

## 2022-12-06 DIAGNOSIS — Z6835 Body mass index (BMI) 35.0-35.9, adult: Secondary | ICD-10-CM | POA: Diagnosis not present

## 2022-12-26 NOTE — Progress Notes (Incomplete)
[facility]  [address] [place],  [place]  [address] [phone]  Clinic Day:  12/26/2022  Referring physician: Angelina Sheriff, MD  HISTORY OF PRESENT ILLNESS:  The patient is an 89 y.o. female with iron deficiency anemia secondary to GI arteriovenous malformations.  In the past, IV Feraheme was necessary to replenish her iron stores and improve her hemoglobin.  She comes in today for routine follow up.  Since her last visit, the patient has been doing well.  She denies having increased fatigue or other overt forms of blood loss which concern her for recurrent iron deficiency anemia.  She continues to take 1 iron pill on a daily basis.   PHYSICAL EXAM:  There were no vitals taken for this visit. Wt Readings from Last 3 Encounters:  06/24/22 204 lb 14.4 oz (92.9 kg)  12/20/21 205 lb (93 kg)  08/19/21 203 lb 4.8 oz (92.2 kg)   There is no height or weight on file to calculate BMI. Performance status (ECOG): 1 Physical Exam Constitutional:      Appearance: Normal appearance. She is not ill-appearing.  HENT:     Mouth/Throat:     Mouth: Mucous membranes are moist.     Pharynx: Oropharynx is clear. No oropharyngeal exudate or posterior oropharyngeal erythema.  Cardiovascular:     Rate and Rhythm: Normal rate and regular rhythm.     Heart sounds: No murmur heard.    No friction rub. No gallop.  Pulmonary:     Effort: Pulmonary effort is normal. No respiratory distress.     Breath sounds: Normal breath sounds. No wheezing, rhonchi or rales.  Abdominal:     General: Bowel sounds are normal. There is no distension.     Palpations: Abdomen is soft. There is no mass.     Tenderness: There is no abdominal tenderness.  Musculoskeletal:        General: No swelling.     Right lower leg: No edema.     Left lower leg: No edema.  Lymphadenopathy:     Cervical: No cervical adenopathy.     Upper Body:     Right upper body: No supraclavicular or  axillary adenopathy.     Left upper body: No supraclavicular or axillary adenopathy.     Lower Body: No right inguinal adenopathy. No left inguinal adenopathy.  Skin:    General: Skin is warm.     Coloration: Skin is not jaundiced.     Findings: No lesion or rash.  Neurological:     General: No focal deficit present.     Mental Status: She is alert and oriented to person, place, and time. Mental status is at baseline.  Psychiatric:        Mood and Affect: Mood normal.        Behavior: Behavior normal.        Thought Content: Thought content normal.   LABS:      Latest Ref Rng & Units 06/24/2022   12:00 AM 12/20/2021   12:00 AM 08/19/2021   12:00 AM  CBC  WBC  8.8     7.5  7.9      Hemoglobin 12.0 - 16.0 12.2     12.4  11.8      Hematocrit 36 - 46 36     36  35      Platelets 150 - 400 K/uL 224     236  248  This result is from an external source.     Latest Reference Range & Units 06/24/22 11:22  Iron 28 - 170 ug/dL 153  UIBC ug/dL 201  TIBC 250 - 450 ug/dL 354  Saturation Ratios 10.4 - 31.8 % 43 (H)  Ferritin 11 - 307 ng/mL 13  (H): Data is abnormally high for her to  ASSESSMENT & PLAN:  Assessment/Plan:  An 89 y.o. 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  I am pleased her hemoglobin and iron parameters have essentially held stable over these past months without any particular intervention.  I have no problem with her continuing to take 1 iron pill on a daily basis.  Clinically, she appears to be doing well.  I will see her back in another 6 months for repeat clinical assessment.  The patient understands all the plans discussed today and is in agreement with them.    Sereena Marando Macarthur Critchley, MD

## 2022-12-27 ENCOUNTER — Inpatient Hospital Stay: Payer: Medicare Other

## 2022-12-27 ENCOUNTER — Ambulatory Visit: Payer: Medicare Other | Admitting: Oncology

## 2023-01-12 DIAGNOSIS — M1611 Unilateral primary osteoarthritis, right hip: Secondary | ICD-10-CM | POA: Diagnosis not present

## 2023-01-12 DIAGNOSIS — M5416 Radiculopathy, lumbar region: Secondary | ICD-10-CM | POA: Diagnosis not present

## 2023-01-18 DIAGNOSIS — R110 Nausea: Secondary | ICD-10-CM | POA: Diagnosis not present

## 2023-01-18 DIAGNOSIS — N2889 Other specified disorders of kidney and ureter: Secondary | ICD-10-CM | POA: Diagnosis not present

## 2023-01-18 DIAGNOSIS — N289 Disorder of kidney and ureter, unspecified: Secondary | ICD-10-CM | POA: Diagnosis not present

## 2023-01-18 DIAGNOSIS — R4182 Altered mental status, unspecified: Secondary | ICD-10-CM | POA: Diagnosis not present

## 2023-01-18 DIAGNOSIS — R531 Weakness: Secondary | ICD-10-CM | POA: Diagnosis not present

## 2023-01-18 DIAGNOSIS — I10 Essential (primary) hypertension: Secondary | ICD-10-CM | POA: Diagnosis not present

## 2023-01-22 DIAGNOSIS — T50904A Poisoning by unspecified drugs, medicaments and biological substances, undetermined, initial encounter: Secondary | ICD-10-CM | POA: Diagnosis not present

## 2023-01-22 DIAGNOSIS — R4182 Altered mental status, unspecified: Secondary | ICD-10-CM | POA: Diagnosis not present

## 2023-01-22 DIAGNOSIS — Z79899 Other long term (current) drug therapy: Secondary | ICD-10-CM | POA: Diagnosis not present

## 2023-01-22 DIAGNOSIS — I10 Essential (primary) hypertension: Secondary | ICD-10-CM | POA: Diagnosis not present

## 2023-01-22 DIAGNOSIS — E7800 Pure hypercholesterolemia, unspecified: Secondary | ICD-10-CM | POA: Diagnosis not present

## 2023-01-22 DIAGNOSIS — I498 Other specified cardiac arrhythmias: Secondary | ICD-10-CM | POA: Diagnosis not present

## 2023-01-22 DIAGNOSIS — R410 Disorientation, unspecified: Secondary | ICD-10-CM | POA: Diagnosis not present

## 2023-03-14 DIAGNOSIS — I10 Essential (primary) hypertension: Secondary | ICD-10-CM | POA: Diagnosis not present

## 2023-03-14 DIAGNOSIS — M1990 Unspecified osteoarthritis, unspecified site: Secondary | ICD-10-CM | POA: Diagnosis not present

## 2023-03-14 DIAGNOSIS — Z6835 Body mass index (BMI) 35.0-35.9, adult: Secondary | ICD-10-CM | POA: Diagnosis not present

## 2023-03-14 DIAGNOSIS — M25551 Pain in right hip: Secondary | ICD-10-CM | POA: Diagnosis not present

## 2023-03-21 DIAGNOSIS — M1611 Unilateral primary osteoarthritis, right hip: Secondary | ICD-10-CM | POA: Diagnosis not present

## 2023-03-21 DIAGNOSIS — M5416 Radiculopathy, lumbar region: Secondary | ICD-10-CM | POA: Diagnosis not present

## 2023-03-28 DIAGNOSIS — M25551 Pain in right hip: Secondary | ICD-10-CM | POA: Diagnosis not present

## 2023-04-11 DIAGNOSIS — M25551 Pain in right hip: Secondary | ICD-10-CM | POA: Diagnosis not present

## 2023-04-14 DIAGNOSIS — M25551 Pain in right hip: Secondary | ICD-10-CM | POA: Diagnosis not present

## 2023-04-14 DIAGNOSIS — Z96643 Presence of artificial hip joint, bilateral: Secondary | ICD-10-CM | POA: Diagnosis not present

## 2023-04-14 DIAGNOSIS — M47816 Spondylosis without myelopathy or radiculopathy, lumbar region: Secondary | ICD-10-CM | POA: Diagnosis not present

## 2023-04-20 ENCOUNTER — Other Ambulatory Visit: Payer: Self-pay | Admitting: Orthopedic Surgery

## 2023-05-15 NOTE — Progress Notes (Signed)
COVID Vaccine Completed:  Yes  Date of COVID positive in last 90 days:  PCP - Gwendlyn Deutscher, MD Cardiologist -   Chest x-ray - 05-16-23 Epic EKG - 05-16-23 Epic Stress Test -  ECHO -  Cardiac Cath -  Pacemaker/ICD device last checked: Spinal Cord Stimulator:  Bowel Prep -   Sleep Study -  CPAP -   Fasting Blood Sugar -  Checks Blood Sugar _____ times a day  Last dose of GLP1 agonist-  N/A GLP1 instructions:  N/A   Last dose of SGLT-2 inhibitors-  N/A SGLT-2 instructions: N/A   Blood Thinner Instructions:  Time Aspirin Instructions: Last Dose:  Activity level:  Can go up a flight of stairs and perform activities of daily living without stopping and without symptoms of chest pain or shortness of breath.  Able to exercise without symptoms  Unable to go up a flight of stairs without symptoms of     Anesthesia review:   Patient denies shortness of breath, fever, cough and chest pain at PAT appointment  Patient verbalized understanding of instructions that were given to them at the PAT appointment. Patient was also instructed that they will need to review over the PAT instructions again at home before surgery.

## 2023-05-15 NOTE — Patient Instructions (Signed)
SURGICAL WAITING ROOM VISITATION Patients having surgery or a procedure may have no more than 2 support people in the waiting area - these visitors may rotate.    Children under the age of 4 must have an adult with them who is not the patient.  If the patient needs to stay at the hospital during part of their recovery, the visitor guidelines for inpatient rooms apply. Pre-op nurse will coordinate an appropriate time for 1 support person to accompany patient in pre-op.  This support person may not rotate.    Please refer to the [facility] website for the visitor guidelines for Inpatients (after your surgery is over and you are in a regular room).       Your procedure is scheduled on: 05-29-23   Report to [facility] Main Entrance    Report to admitting at 7:15 AM   Call this number if you have problems the morning of surgery [phone]   Do not eat food :After Midnight.   After Midnight you may have the following liquids until 6:45 AM DAY OF SURGERY  Water Non-Citrus Juices (without pulp, NO RED-Apple, White grape, White cranberry) Black Coffee (NO MILK/CREAM OR CREAMERS, sugar ok)  Clear Tea (NO MILK/CREAM OR CREAMERS, sugar ok) regular and decaf                             Plain Jell-O (NO RED)                                           Fruit ices (not with fruit pulp, NO RED)                                     Popsicles (NO RED)                                                               Sports drinks like Gatorade (NO RED)                   The day of surgery:  Drink ONE (1) Pre-Surgery Clear Ensure at 6:45 AM the morning of surgery. Drink in one sitting. Do not sip.  This drink was given to you during your hospital  pre-op appointment visit. Nothing else to drink after completing the Pre-Surgery Clear Ensure.          If you have questions, please contact your surgeon's office.   FOLLOW  ANY ADDITIONAL PRE OP INSTRUCTIONS YOU RECEIVED FROM YOUR SURGEON'S  OFFICE!!!     Oral Hygiene is also important to reduce your risk of infection.                                    Remember - BRUSH YOUR TEETH THE MORNING OF SURGERY WITH YOUR REGULAR TOOTHPASTE   Do NOT smoke after Midnight   Take these medicines the morning of surgery with A SIP OF WATER:   Levothyroxine  Omeprazole  Rosuvastatin  Bring CPAP mask and tubing day of surgery.                              You may not have any metal on your body including hair pins, jewelry, and body piercing             Do not wear make-up, lotions, powders, perfumes or deodorant  Do not wear nail polish including gel and S&S, artificial/acrylic nails, or any other type of covering on natural nails including finger and toenails. If you have artificial nails, gel coating, etc. that needs to be removed by a nail salon please have this removed prior to surgery or surgery may need to be canceled/ delayed if the surgeon/ anesthesia feels like they are unable to be safely monitored.   Do not shave  48 hours prior to surgery.    Do not bring valuables to the hospital. [facility] IS NOT RESPONSIBLE   FOR VALUABLES.   Contacts, dentures or bridgework may not be worn into surgery.   Bring small overnight bag day of surgery.   DO NOT BRING YOUR HOME MEDICATIONS TO THE HOSPITAL. PHARMACY WILL DISPENSE MEDICATIONS LISTED ON YOUR MEDICATION LIST TO YOU DURING YOUR ADMISSION IN THE HOSPITAL!   Special Instructions: Bring a copy of your healthcare power of attorney and living will documents the day of surgery if you haven't scanned them before.              Please read over the following fact sheets you were given: IF YOU HAVE QUESTIONS ABOUT YOUR PRE-OP INSTRUCTIONS PLEASE CALL [phone] [facility]  If you received a COVID test during your pre-op visit  it is requested that you wear a mask when out in public, stay away from anyone that may not be feeling well and notify your surgeon if you develop symptoms. If you  test positive for Covid or have been in contact with anyone that has tested positive in the last 10 days please notify you surgeon.  [facility] - Preparing for Surgery Before surgery, you can play an important role.  Because skin is not sterile, your skin needs to be as free of germs as possible.  You can reduce the number of germs on your skin by washing with CHG (chlorahexidine gluconate) soap before surgery.  CHG is an antiseptic cleaner which kills germs and bonds with the skin to continue killing germs even after washing. Please DO NOT use if you have an allergy to CHG or antibacterial soaps.  If your skin becomes reddened/irritated stop using the CHG and inform your nurse when you arrive at Short Stay. Do not shave (including legs and underarms) for at least 48 hours prior to the first CHG shower.  You may shave your face/neck.  Please follow these instructions carefully:  1.  Shower with CHG Soap the night before surgery and the  morning of surgery.  2.  If you choose to wash your hair, wash your hair first as usual with your normal  shampoo.  3.  After you shampoo, rinse your hair and body thoroughly to remove the shampoo.                             4.  Use CHG as you would any other liquid soap.  You can apply chg directly to the skin and wash.  Gently with a scrungie  or clean washcloth.  5.  Apply the CHG Soap to your body ONLY FROM THE NECK DOWN.   Do   not use on face/ open                           Wound or open sores. Avoid contact with eyes, ears mouth and   genitals (private parts).                       Wash face,  Genitals (private parts) with your normal soap.             6.  Wash thoroughly, paying special attention to the area where your    surgery  will be performed.  7.  Thoroughly rinse your body with warm water from the neck down.  8.  DO NOT shower/wash with your normal soap after using and rinsing off the CHG Soap.                9.  Pat yourself dry with a clean  towel.            10.  Wear clean pajamas.            11.  Place clean sheets on your bed the night of your first shower and do not  sleep with pets. Day of Surgery : Do not apply any lotions/deodorants the morning of surgery.  Please wear clean clothes to the hospital/surgery center.  FAILURE TO FOLLOW THESE INSTRUCTIONS MAY RESULT IN THE CANCELLATION OF YOUR SURGERY  PATIENT SIGNATURE_________________________________  NURSE SIGNATURE__________________________________  ________________________________________________________________________    Alexis Stevenson  An incentive spirometer is a tool that can help keep your lungs clear and active. This tool measures how well you are filling your lungs with each breath. Taking long deep breaths may help reverse or decrease the chance of developing breathing (pulmonary) problems (especially infection) following: A long period of time when you are unable to move or be active. BEFORE THE PROCEDURE  If the spirometer includes an indicator to show your best effort, your nurse or respiratory therapist will set it to a desired goal. If possible, sit up straight or lean slightly forward. Try not to slouch. Hold the incentive spirometer in an upright position. INSTRUCTIONS FOR USE  Sit on the edge of your bed if possible, or sit up as far as you can in bed or on a chair. Hold the incentive spirometer in an upright position. Breathe out normally. Place the mouthpiece in your mouth and seal your lips tightly around it. Breathe in slowly and as deeply as possible, raising the piston or the ball toward the top of the column. Hold your breath for 3-5 seconds or for as long as possible. Allow the piston or ball to fall to the bottom of the column. Remove the mouthpiece from your mouth and breathe out normally. Rest for a few seconds and repeat Steps 1 through 7 at least 10 times every 1-2 hours when you are awake. Take your time and take a few normal  breaths between deep breaths. The spirometer may include an indicator to show your best effort. Use the indicator as a goal to work toward during each repetition. After each set of 10 deep breaths, practice coughing to be sure your lungs are clear. If you have an incision (the cut made at the time of surgery), support your incision when coughing by placing a pillow  or rolled up towels firmly against it. Once you are able to get out of bed, walk around indoors and cough well. You may stop using the incentive spirometer when instructed by your caregiver.  RISKS AND COMPLICATIONS Take your time so you do not get dizzy or light-headed. If you are in pain, you may need to take or ask for pain medication before doing incentive spirometry. It is harder to take a deep breath if you are having pain. AFTER USE Rest and breathe slowly and easily. It can be helpful to keep track of a log of your progress. Your caregiver can provide you with a simple table to help with this. If you are using the spirometer at home, follow these instructions: SEEK MEDICAL CARE IF:  You are having difficultly using the spirometer. You have trouble using the spirometer as often as instructed. Your pain medication is not giving enough relief while using the spirometer. You develop fever of 100.5 F (38.1 C) or higher. SEEK IMMEDIATE MEDICAL CARE IF:  You cough up bloody sputum that had not been present before. You develop fever of 102 F (38.9 C) or greater. You develop worsening pain at or near the incision site. MAKE SURE YOU:  Understand these instructions. Will watch your condition. Will get help right away if you are not doing well or get worse. Document Released: 03/13/2007 Document Revised: 01/23/2012 Document Reviewed: 05/14/2007 [employer] Patient Information 2014 [employer], [place].   ________________________________________________________________________ WHAT IS A BLOOD TRANSFUSION? Blood Transfusion  Information  A transfusion is the replacement of blood or some of its parts. Blood is made up of multiple cells which provide different functions. Red blood cells carry oxygen and are used for blood loss replacement. White blood cells fight against infection. Platelets control bleeding. Plasma helps clot blood. Other blood products are available for specialized needs, such as hemophilia or other clotting disorders. BEFORE THE TRANSFUSION  Who gives blood for transfusions?  Healthy volunteers who are fully evaluated to make sure their blood is safe. This is blood bank blood. Transfusion therapy is the safest it has ever been in the practice of medicine. Before blood is taken from a donor, a complete history is taken to make sure that person has no history of diseases nor engages in risky social behavior (examples are intravenous drug use or sexual activity with multiple partners). The donor's travel history is screened to minimize risk of transmitting infections, such as malaria. The donated blood is tested for signs of infectious diseases, such as HIV and hepatitis. The blood is then tested to be sure it is compatible with you in order to minimize the chance of a transfusion reaction. If you or a relative donates blood, this is often done in anticipation of surgery and is not appropriate for emergency situations. It takes many days to process the donated blood. RISKS AND COMPLICATIONS Although transfusion therapy is very safe and saves many lives, the main dangers of transfusion include:  Getting an infectious disease. Developing a transfusion reaction. This is an allergic reaction to something in the blood you were given. Every precaution is taken to prevent this. The decision to have a blood transfusion has been considered carefully by your caregiver before blood is given. Blood is not given unless the benefits outweigh the risks. AFTER THE TRANSFUSION Right after receiving a blood transfusion,  you will usually feel much better and more energetic. This is especially true if your red blood cells have gotten low (anemic). The transfusion raises the  level of the red blood cells which carry oxygen, and this usually causes an energy increase. The nurse administering the transfusion will monitor you carefully for complications. HOME CARE INSTRUCTIONS  No special instructions are needed after a transfusion. You may find your energy is better. Speak with your caregiver about any limitations on activity for underlying diseases you may have. SEEK MEDICAL CARE IF:  Your condition is not improving after your transfusion. You develop redness or irritation at the intravenous (IV) site. SEEK IMMEDIATE MEDICAL CARE IF:  Any of the following symptoms occur over the next 12 hours: Shaking chills. You have a temperature by mouth above 102 F (38.9 C), not controlled by medicine. Chest, back, or muscle pain. People around you feel you are not acting correctly or are confused. Shortness of breath or difficulty breathing. Dizziness and fainting. You get a rash or develop hives. You have a decrease in urine output. Your urine turns a dark color or changes to pink, red, or brown. Any of the following symptoms occur over the next 10 days: You have a temperature by mouth above 102 F (38.9 C), not controlled by medicine. Shortness of breath. Weakness after normal activity. The white part of the eye turns yellow (jaundice). You have a decrease in the amount of urine or are urinating less often. Your urine turns a dark color or changes to pink, red, or brown. Document Released: 10/28/2000 Document Revised: 01/23/2012 Document Reviewed: 06/16/2008 [facility] Patient Information 2014 [place], [place].  _______________________________________________________________________

## 2023-05-16 ENCOUNTER — Encounter (HOSPITAL_COMMUNITY)
Admission: RE | Admit: 2023-05-16 | Discharge: 2023-05-16 | Disposition: A | Discharge status: Home / Self Care | Payer: Medicare Other | Source: Ambulatory Visit | Attending: Orthopedic Surgery | Admitting: Orthopedic Surgery

## 2023-05-16 ENCOUNTER — Ambulatory Visit (HOSPITAL_COMMUNITY)
Admission: RE | Admit: 2023-05-16 | Discharge: 2023-05-16 | Disposition: A | Discharge status: Home / Self Care | Payer: Medicare Other | Source: Ambulatory Visit | Attending: Orthopedic Surgery | Admitting: Orthopedic Surgery

## 2023-05-16 ENCOUNTER — Other Ambulatory Visit: Payer: Self-pay

## 2023-05-16 ENCOUNTER — Encounter (HOSPITAL_COMMUNITY): Payer: Self-pay

## 2023-05-16 VITALS — BP 138/59 | HR 94 | Temp 98.4°F | Resp 20 | Ht 64.0 in | Wt 190.0 lb

## 2023-05-16 DIAGNOSIS — Z01818 Encounter for other preprocedural examination: Secondary | ICD-10-CM | POA: Diagnosis not present

## 2023-05-16 DIAGNOSIS — D649 Anemia, unspecified: Secondary | ICD-10-CM | POA: Insufficient documentation

## 2023-05-16 DIAGNOSIS — I10 Essential (primary) hypertension: Secondary | ICD-10-CM | POA: Insufficient documentation

## 2023-05-16 DIAGNOSIS — I700 Atherosclerosis of aorta: Secondary | ICD-10-CM | POA: Diagnosis not present

## 2023-05-16 HISTORY — DX: Headache, unspecified: R51.9

## 2023-05-16 HISTORY — DX: Unspecified osteoarthritis, unspecified site: M19.90

## 2023-05-16 LAB — CBC
HCT: 36.6 % (ref 36.0–46.0)
Hemoglobin: 12 g/dL (ref 12.0–15.0)
MCH: 33.1 pg (ref 26.0–34.0)
MCHC: 32.8 g/dL (ref 30.0–36.0)
MCV: 101.1 fL — ABNORMAL HIGH (ref 80.0–100.0)
Platelets: 249 10*3/uL (ref 150–400)
RBC: 3.62 MIL/uL — ABNORMAL LOW (ref 3.87–5.11)
RDW: 13.6 % (ref 11.5–15.5)
WBC: 7.6 10*3/uL (ref 4.0–10.5)
nRBC: 0 % (ref 0.0–0.2)

## 2023-05-16 LAB — BASIC METABOLIC PANEL
Anion gap: 11 (ref 5–15)
BUN: 21 mg/dL (ref 8–23)
CO2: 25 mmol/L (ref 22–32)
Calcium: 9.4 mg/dL (ref 8.9–10.3)
Chloride: 103 mmol/L (ref 98–111)
Creatinine, Ser: 1.16 mg/dL — ABNORMAL HIGH (ref 0.44–1.00)
GFR, Estimated: 46 mL/min — ABNORMAL LOW (ref >60)
Glucose, Bld: 92 mg/dL (ref 70–99)
Potassium: 4.2 mmol/L (ref 3.5–5.1)
Sodium: 139 mmol/L (ref 135–145)

## 2023-05-16 LAB — TYPE AND SCREEN
ABO/RH(D): A POS
Antibody Screen: NEGATIVE

## 2023-05-16 LAB — SURGICAL PCR SCREEN
MRSA, PCR: NEGATIVE
Staphylococcus aureus: NEGATIVE

## 2023-05-26 DIAGNOSIS — T84020A Dislocation of internal right hip prosthesis, initial encounter: Principal | ICD-10-CM | POA: Diagnosis present

## 2023-05-26 NOTE — H&P (Signed)
TOTAL HIP REVISION ADMISSION H&P  Patient is admitted for right revision total hip arthroplasty.  Subjective:  Chief Complaint: right hip pain  HPI: Alexis Stevenson, 89 y.o. female, has a history of pain and functional disability in the right hip due to  instability  and patient has failed non-surgical conservative treatments for greater than 12 weeks to include flexibility and strengthening excercises, supervised PT with diminished ADL's post treatment, use of assistive devices, and activity modification. The indications for the revision total hip arthroplasty are  instability .  Onset of symptoms was gradual starting 1 years ago with rapidlly worsening course since that time.  Prior procedures on the right hip include arthroplasty.  Patient currently rates pain in the right hip at 10 out of 10 with activity.  There is worsening of pain with activity and weight bearing, pain with passive range of motion, and instability . Patient has evidence of joint subluxation and flat cup  by imaging studies.  This condition presents safety issues increasing the risk of falls.    There is no current active infection.  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   Iron deficiency anemia due to chronic blood loss 03/03/2021   Past Medical History:  Diagnosis Date   Anemia    Arthritis    AVM (arteriovenous malformation)    small intestines   Essential hypertension    Headache    Hyperlipidemia    Hypothyroidism    Imbalance    chronic   Osteopenia    Stroke [facility])    H/O    Past Surgical History:  Procedure Laterality Date   APPENDECTOMY     CATARACT EXTRACTION     CERVICAL SPINE SURGERY     rod in place, C2-7   CESAREAN SECTION     BTL   CHOLECYSTECTOMY     HYSTERECTOMY ABDOMINAL WITH SALPINGO-OOPHORECTOMY     REPLACEMENT TOTAL KNEE Right    x 2   SHOULDER ARTHROSCOPY Right 2012   TONSILLECTOMY     TOTAL HIP  ARTHROPLASTY Bilateral [id number]    No current facility-administered medications for this encounter.   Current Outpatient Medications  Medication Sig Dispense Refill Last Dose   Cholecalciferol 50 MCG (2000 UT) TABS Take 2,000 Units by mouth daily.      cyanocobalamin 1000 MCG tablet Take 1,000 mcg by mouth daily.      diphenhydramine-acetaminophen (TYLENOL PM) 25-500 MG TABS tablet Take 1 tablet by mouth at bedtime. Nightly as needed      ferrous sulfate 325 (65 FE) MG tablet Take 325 mg by mouth daily.      levothyroxine (SYNTHROID) 100 MCG tablet Take 100 mcg by mouth daily before breakfast.      Multiple Vitamins-Minerals (CENTRUM SILVER 50+WOMEN) TABS Take 1 tablet by mouth daily.      olmesartan (BENICAR) 20 MG tablet Take 20 mg by mouth daily.      omeprazole (PRILOSEC) 20 MG capsule Take 20 mg by mouth daily.      rosuvastatin (CRESTOR) 20 MG tablet Take 20 mg by mouth daily.      senna (SENOKOT) 8.6 MG tablet Take 1 tablet by mouth as needed for constipation.      Vitamin D, Ergocalciferol, (DRISDOL) 1.25 MG (50000 UNIT) CAPS capsule Take 50,000 Units by mouth 2 (two) times a week. (Patient not taking: Reported on 05/11/2023)   Not Taking   Allergies  Allergen  Reactions   Codeine Nausea And Vomiting   Effexor Xr [Venlafaxine Hcl]     intolerant   Furosemide     intolerant   Gabapentin    Lidocaine Hypertension   Loratadine Hives   Naproxen Hypertension    All Nsaids   Nsaids    Penicillin G Other (See Comments)    Mouth raw and yeast infection   Plendil [Felodipine] Other (See Comments)    constipation   Spiriva Respimat [Tiotropium Bromide Monohydrate]     Heart race   Tramadol Nausea Only    Social History   Tobacco Use   Smoking status: Former    Current packs/day: 0.00    Types: Cigarettes    Quit date: 11/09/2005    Years since quitting: 17.5   Smokeless tobacco: Never   Tobacco comments:    1-2 ppd  Substance Use Topics   Alcohol use: Never    Family  History  Problem Relation Age of Onset   Stroke Mother    Heart disease Mother    Hypertension Mother    Heart attack Father        age 82's   Lung cancer Brother       Review of Systems  Constitutional:  Positive for fatigue.  HENT: Negative.    Eyes: Negative.   Respiratory: Negative.    Cardiovascular:  Positive for leg swelling.       HTN  Gastrointestinal: Negative.   Endocrine: Negative.   Genitourinary: Negative.   Musculoskeletal:  Positive for arthralgias.  Skin: Negative.   Allergic/Immunologic: Negative.   Neurological: Negative.   Hematological: Negative.   Psychiatric/Behavioral: Negative.      Objective:  Physical Exam Constitutional:      Appearance: Normal appearance. She is normal weight.  HENT:     Head: Normocephalic and atraumatic.     Nose: Nose normal.  Eyes:     Pupils: Pupils are equal, round, and reactive to light.  Cardiovascular:     Pulses: Normal pulses.  Pulmonary:     Effort: Pulmonary effort is normal.  Musculoskeletal:     Cervical back: Normal range of motion and neck supple.     Comments: Scar to her posterior right total hip is well-healed internal rotation again is to 0 where she then is has a sensation of subluxation or pressure.  She easily externally rotates to 40.  Skin:    General: Skin is warm and dry.  Neurological:     General: No focal deficit present.     Mental Status: She is alert and oriented to person, place, and time. Mental status is at baseline.  Psychiatric:        Mood and Affect: Mood normal.        Behavior: Behavior normal.        Thought Content: Thought content normal.        Judgment: Judgment normal.     Vital signs in last 24 hours:     Labs:   Estimated body mass index is 32.61 kg/m as calculated from the following:   Height as of 05/16/23: 5\' 4"  (1.626 m).   Weight as of 05/16/23: 86.2 kg.  Imaging Review:  CT scan of the right hip is been accomplished.  It does show that the right  acetabular component is retroverted about 5 which would explain the patient's symptomatology.  I did call her on the phone and discussed the findings with her.  She would like to proceed with  revision of the acetabular component.  Based on the CT findings there should be plenty of bone stock to remove the [employer] Trident cup and revise it to a new acetabular component.      Assessment/Plan:  End stage arthritis, right hip(s) with failed previous arthroplasty.  The patient history, physical examination, clinical judgement of the provider and imaging studies are consistent with end stage degenerative joint disease of the right hip(s), previous total hip arthroplasty. Revision total hip arthroplasty is deemed medically necessary. The treatment options including medical management, injection therapy, arthroscopy and arthroplasty were discussed at length. The risks and benefits of total hip arthroplasty were presented and reviewed. The risks due to aseptic loosening, infection, stiffness, dislocation/subluxation,  thromboembolic complications and other imponderables were discussed.  The patient acknowledged the explanation, agreed to proceed with the plan and consent was signed. Patient is being admitted for inpatient treatment for surgery, pain control, PT, OT, prophylactic antibiotics, VTE prophylaxis, progressive ambulation and ADL's and discharge planning. The patient is planning to be discharged home with home health services

## 2023-05-27 NOTE — Anesthesia Preprocedure Evaluation (Addendum)
Anesthesia Evaluation  Patient identified by MRN, date of birth, ID band Patient awake    Reviewed: Allergy & Precautions, NPO status , Patient's Chart, lab work & pertinent test results  Airway Mallampati: II  TM Distance: >3 FB Neck ROM: Full    Dental no notable dental hx. (+) Teeth Intact, Dental Advisory Given   Pulmonary former smoker   Pulmonary exam normal breath sounds clear to auscultation       Cardiovascular hypertension, Normal cardiovascular exam Rhythm:Regular Rate:Normal     Neuro/Psych  Headaches CVA, No Residual Symptoms    GI/Hepatic   Endo/Other  Hypothyroidism    Renal/GU Lab Results      Component                Value               Date                              K                        4.2                 05/16/2023              CREATININE               1.16 (H)            05/16/2023                GFRNONAA                 46 (L)              05/16/2023                         Musculoskeletal  (+) Arthritis , Osteoarthritis,    Abdominal   Peds  Hematology Lab Results      Component                Value               Date                      WBC                      7.6                 05/16/2023                HGB                      12.0                05/16/2023                HCT                      36.6                05/16/2023                  PLT                      249  05/16/2023              Anesthesia Other Findings ALL: see list  Reproductive/Obstetrics                             Anesthesia Physical Anesthesia Plan  ASA: 3  Anesthesia Plan: Spinal   Post-op Pain Management: Regional block* and Minimal or no pain anticipated   Induction:   PONV Risk Score and Plan: Propofol infusion, Treatment may vary due to age or medical condition and Ondansetron  Airway Management Planned: Natural Airway and Nasal  Cannula  Additional Equipment: None  Intra-op Plan:   Post-operative Plan:   Informed Consent: I have reviewed the patients History and Physical, chart, labs and discussed the procedure including the risks, benefits and alternatives for the proposed anesthesia with the patient or authorized representative who has indicated his/her understanding and acceptance.     Dental advisory given  Plan Discussed with: CRNA  Anesthesia Plan Comments:        Anesthesia Quick Evaluation

## 2023-05-28 MED ORDER — TRANEXAMIC ACID 1000 MG/10ML IV SOLN
2000.0000 mg | INTRAVENOUS | Status: DC
  Filled 2023-05-28: qty 20

## 2023-05-29 ENCOUNTER — Inpatient Hospital Stay (HOSPITAL_COMMUNITY): Payer: Medicare Other

## 2023-05-29 ENCOUNTER — Encounter (HOSPITAL_COMMUNITY): Payer: Self-pay | Admitting: Orthopedic Surgery

## 2023-05-29 ENCOUNTER — Inpatient Hospital Stay (HOSPITAL_COMMUNITY)
Admission: RE | Admit: 2023-05-29 | Discharge: 2023-05-30 | DRG: 468 | Disposition: A | Discharge status: Home / Self Care | Payer: Medicare Other | Attending: Orthopedic Surgery | Admitting: Orthopedic Surgery

## 2023-05-29 ENCOUNTER — Other Ambulatory Visit (HOSPITAL_COMMUNITY): Payer: Self-pay

## 2023-05-29 ENCOUNTER — Other Ambulatory Visit: Payer: Self-pay

## 2023-05-29 ENCOUNTER — Inpatient Hospital Stay (HOSPITAL_COMMUNITY): Payer: Medicare Other | Admitting: Anesthesiology

## 2023-05-29 ENCOUNTER — Encounter (HOSPITAL_COMMUNITY)
Admission: RE | Disposition: A | Discharge status: Home / Self Care | Payer: Self-pay | Source: Home / Self Care | Attending: Orthopedic Surgery

## 2023-05-29 DIAGNOSIS — E785 Hyperlipidemia, unspecified: Secondary | ICD-10-CM | POA: Diagnosis present

## 2023-05-29 DIAGNOSIS — Z79899 Other long term (current) drug therapy: Secondary | ICD-10-CM

## 2023-05-29 DIAGNOSIS — M8580 Other specified disorders of bone density and structure, unspecified site: Secondary | ICD-10-CM | POA: Diagnosis present

## 2023-05-29 DIAGNOSIS — Z96641 Presence of right artificial hip joint: Secondary | ICD-10-CM | POA: Diagnosis not present

## 2023-05-29 DIAGNOSIS — Z6832 Body mass index (BMI) 32.0-32.9, adult: Secondary | ICD-10-CM

## 2023-05-29 DIAGNOSIS — Z888 Allergy status to other drugs, medicaments and biological substances status: Secondary | ICD-10-CM | POA: Diagnosis not present

## 2023-05-29 DIAGNOSIS — T84091A Other mechanical complication of internal left hip prosthesis, initial encounter: Secondary | ICD-10-CM

## 2023-05-29 DIAGNOSIS — M2170 Unequal limb length (acquired), unspecified site: Secondary | ICD-10-CM | POA: Diagnosis present

## 2023-05-29 DIAGNOSIS — M25551 Pain in right hip: Secondary | ICD-10-CM | POA: Diagnosis not present

## 2023-05-29 DIAGNOSIS — Z884 Allergy status to anesthetic agent status: Secondary | ICD-10-CM | POA: Diagnosis not present

## 2023-05-29 DIAGNOSIS — Z96651 Presence of right artificial knee joint: Secondary | ICD-10-CM | POA: Diagnosis present

## 2023-05-29 DIAGNOSIS — I129 Hypertensive chronic kidney disease with stage 1 through stage 4 chronic kidney disease, or unspecified chronic kidney disease: Secondary | ICD-10-CM | POA: Diagnosis present

## 2023-05-29 DIAGNOSIS — Z96642 Presence of left artificial hip joint: Secondary | ICD-10-CM | POA: Diagnosis present

## 2023-05-29 DIAGNOSIS — Y792 Prosthetic and other implants, materials and accessory orthopedic devices associated with adverse incidents: Secondary | ICD-10-CM | POA: Diagnosis not present

## 2023-05-29 DIAGNOSIS — Z886 Allergy status to analgesic agent status: Secondary | ICD-10-CM | POA: Diagnosis not present

## 2023-05-29 DIAGNOSIS — Z8673 Personal history of transient ischemic attack (TIA), and cerebral infarction without residual deficits: Secondary | ICD-10-CM

## 2023-05-29 DIAGNOSIS — E669 Obesity, unspecified: Secondary | ICD-10-CM | POA: Diagnosis present

## 2023-05-29 DIAGNOSIS — Z880 Allergy status to penicillin: Secondary | ICD-10-CM

## 2023-05-29 DIAGNOSIS — Z79890 Hormone replacement therapy (postmenopausal): Secondary | ICD-10-CM | POA: Diagnosis not present

## 2023-05-29 DIAGNOSIS — N1831 Chronic kidney disease, stage 3a: Secondary | ICD-10-CM | POA: Diagnosis present

## 2023-05-29 DIAGNOSIS — Z90710 Acquired absence of both cervix and uterus: Secondary | ICD-10-CM

## 2023-05-29 DIAGNOSIS — T84020A Dislocation of internal right hip prosthesis, initial encounter: Principal | ICD-10-CM | POA: Diagnosis present

## 2023-05-29 DIAGNOSIS — Z87891 Personal history of nicotine dependence: Secondary | ICD-10-CM

## 2023-05-29 DIAGNOSIS — Z885 Allergy status to narcotic agent status: Secondary | ICD-10-CM | POA: Diagnosis not present

## 2023-05-29 DIAGNOSIS — E039 Hypothyroidism, unspecified: Secondary | ICD-10-CM | POA: Diagnosis present

## 2023-05-29 DIAGNOSIS — Z8249 Family history of ischemic heart disease and other diseases of the circulatory system: Secondary | ICD-10-CM

## 2023-05-29 DIAGNOSIS — Z471 Aftercare following joint replacement surgery: Secondary | ICD-10-CM | POA: Diagnosis not present

## 2023-05-29 DIAGNOSIS — K219 Gastro-esophageal reflux disease without esophagitis: Secondary | ICD-10-CM | POA: Diagnosis present

## 2023-05-29 DIAGNOSIS — D649 Anemia, unspecified: Secondary | ICD-10-CM | POA: Diagnosis not present

## 2023-05-29 DIAGNOSIS — I10 Essential (primary) hypertension: Secondary | ICD-10-CM

## 2023-05-29 DIAGNOSIS — Z823 Family history of stroke: Secondary | ICD-10-CM | POA: Diagnosis not present

## 2023-05-29 DIAGNOSIS — I639 Cerebral infarction, unspecified: Secondary | ICD-10-CM | POA: Diagnosis not present

## 2023-05-29 DIAGNOSIS — Z9049 Acquired absence of other specified parts of digestive tract: Secondary | ICD-10-CM | POA: Diagnosis not present

## 2023-05-29 DIAGNOSIS — Z96649 Presence of unspecified artificial hip joint: Secondary | ICD-10-CM | POA: Diagnosis present

## 2023-05-29 DIAGNOSIS — Z801 Family history of malignant neoplasm of trachea, bronchus and lung: Secondary | ICD-10-CM

## 2023-05-29 DIAGNOSIS — Z96643 Presence of artificial hip joint, bilateral: Secondary | ICD-10-CM | POA: Diagnosis not present

## 2023-05-29 HISTORY — PX: TOTAL HIP REVISION: SHX763

## 2023-05-29 LAB — ABO/RH: ABO/RH(D): A POS

## 2023-05-29 SURGERY — TOTAL HIP REVISION
Anesthesia: Spinal | Site: Hip | Laterality: Right

## 2023-05-29 MED ORDER — CEFAZOLIN SODIUM-DEXTROSE 2-4 GM/100ML-% IV SOLN
2.0000 g | INTRAVENOUS | Status: AC
  Administered 2023-05-29: 2 g via INTRAVENOUS
  Filled 2023-05-29: qty 100

## 2023-05-29 MED ORDER — IRBESARTAN 150 MG PO TABS
150.0000 mg | ORAL_TABLET | Freq: Every day | ORAL | Status: DC
  Administered 2023-05-29 – 2023-05-30 (×2): 150 mg via ORAL
  Filled 2023-05-29 (×2): qty 1

## 2023-05-29 MED ORDER — DEXAMETHASONE SODIUM PHOSPHATE 10 MG/ML IJ SOLN
INTRAMUSCULAR | Status: DC | PRN
  Administered 2023-05-29: 4 mg via INTRAVENOUS

## 2023-05-29 MED ORDER — PHENYLEPHRINE HCL-NACL 20-0.9 MG/250ML-% IV SOLN
INTRAVENOUS | Status: DC | PRN
  Administered 2023-05-29: 20 ug/min via INTRAVENOUS

## 2023-05-29 MED ORDER — PHENOL 1.4 % MT LIQD: 1.0000 | OROMUCOSAL | Status: DC | PRN

## 2023-05-29 MED ORDER — SODIUM CHLORIDE 0.9% FLUSH
INTRAVENOUS | Status: DC | PRN
  Administered 2023-05-29: 30 mL

## 2023-05-29 MED ORDER — MENTHOL 3 MG MT LOZG: 1.0000 | LOZENGE | OROMUCOSAL | Status: DC | PRN

## 2023-05-29 MED ORDER — HYDROMORPHONE HCL 1 MG/ML IJ SOLN: 0.5000 mg | INTRAMUSCULAR | Status: DC | PRN

## 2023-05-29 MED ORDER — ALUM & MAG HYDROXIDE-SIMETH 200-200-20 MG/5ML PO SUSP: 30.0000 mL | ORAL | Status: DC | PRN

## 2023-05-29 MED ORDER — CHLORHEXIDINE GLUCONATE 0.12 % MT SOLN
15.0000 mL | Freq: Once | OROMUCOSAL | Status: AC
  Administered 2023-05-29: 15 mL via OROMUCOSAL

## 2023-05-29 MED ORDER — FENTANYL CITRATE (PF) 100 MCG/2ML IJ SOLN
INTRAMUSCULAR | Status: DC | PRN
  Administered 2023-05-29 (×2): 25 ug via INTRAVENOUS

## 2023-05-29 MED ORDER — KCL IN DEXTROSE-NACL 20-5-0.45 MEQ/L-%-% IV SOLN
INTRAVENOUS | Status: DC
  Filled 2023-05-29 (×2): qty 1000

## 2023-05-29 MED ORDER — PANTOPRAZOLE SODIUM 40 MG PO TBEC
40.0000 mg | DELAYED_RELEASE_TABLET | Freq: Every day | ORAL | Status: DC
  Administered 2023-05-30: 40 mg via ORAL
  Filled 2023-05-29: qty 1

## 2023-05-29 MED ORDER — PROPOFOL 1000 MG/100ML IV EMUL
INTRAVENOUS | Status: AC
  Filled 2023-05-29: qty 100

## 2023-05-29 MED ORDER — BISACODYL 5 MG PO TBEC: 5.0000 mg | DELAYED_RELEASE_TABLET | Freq: Every day | ORAL | Status: DC | PRN

## 2023-05-29 MED ORDER — DEXAMETHASONE SODIUM PHOSPHATE 10 MG/ML IJ SOLN: 10.0000 mg | Freq: Once | INTRAMUSCULAR | Status: DC

## 2023-05-29 MED ORDER — ORAL CARE MOUTH RINSE: 15.0000 mL | Freq: Once | OROMUCOSAL | Status: AC

## 2023-05-29 MED ORDER — BUPIVACAINE-EPINEPHRINE 0.25% -1:200000 IJ SOLN
INTRAMUSCULAR | Status: AC
  Filled 2023-05-29: qty 1

## 2023-05-29 MED ORDER — FENTANYL CITRATE PF 50 MCG/ML IJ SOSY
25.0000 ug | PREFILLED_SYRINGE | INTRAMUSCULAR | Status: DC | PRN
  Administered 2023-05-29: 50 ug via INTRAVENOUS

## 2023-05-29 MED ORDER — ASPIRIN 81 MG PO TBEC
81.0000 mg | DELAYED_RELEASE_TABLET | Freq: Two times a day (BID) | ORAL | 0 refills | Status: DC
  Filled 2023-05-29: qty 60, 30d supply, fill #0

## 2023-05-29 MED ORDER — ACETAMINOPHEN 500 MG PO TABS
1000.0000 mg | ORAL_TABLET | Freq: Four times a day (QID) | ORAL | Status: DC
  Administered 2023-05-29 – 2023-05-30 (×2): 1000 mg via ORAL
  Filled 2023-05-29 (×2): qty 2

## 2023-05-29 MED ORDER — PROPOFOL 500 MG/50ML IV EMUL
INTRAVENOUS | Status: DC | PRN
  Administered 2023-05-29: 75 ug/kg/min via INTRAVENOUS

## 2023-05-29 MED ORDER — ONDANSETRON HCL 4 MG/2ML IJ SOLN: 4.0000 mg | Freq: Four times a day (QID) | INTRAMUSCULAR | Status: DC | PRN

## 2023-05-29 MED ORDER — ONDANSETRON HCL 4 MG PO TABS: 4.0000 mg | ORAL_TABLET | Freq: Four times a day (QID) | ORAL | Status: DC | PRN

## 2023-05-29 MED ORDER — FERROUS SULFATE 325 (65 FE) MG PO TABS
325.0000 mg | ORAL_TABLET | Freq: Every day | ORAL | Status: DC
  Administered 2023-05-30: 325 mg via ORAL
  Filled 2023-05-29: qty 1

## 2023-05-29 MED ORDER — POLYETHYLENE GLYCOL 3350 17 G PO PACK: 17.0000 g | PACK | Freq: Every day | ORAL | Status: DC | PRN

## 2023-05-29 MED ORDER — ASPIRIN 81 MG PO CHEW
81.0000 mg | CHEWABLE_TABLET | Freq: Two times a day (BID) | ORAL | Status: DC
  Administered 2023-05-29 – 2023-05-30 (×2): 81 mg via ORAL
  Filled 2023-05-29 (×2): qty 1

## 2023-05-29 MED ORDER — DOCUSATE SODIUM 100 MG PO CAPS
100.0000 mg | ORAL_CAPSULE | Freq: Two times a day (BID) | ORAL | Status: DC
  Administered 2023-05-29 – 2023-05-30 (×2): 100 mg via ORAL
  Filled 2023-05-29 (×2): qty 1

## 2023-05-29 MED ORDER — FENTANYL CITRATE PF 50 MCG/ML IJ SOSY
PREFILLED_SYRINGE | INTRAMUSCULAR | Status: AC
  Administered 2023-05-29: 50 ug via INTRAVENOUS
  Filled 2023-05-29: qty 2

## 2023-05-29 MED ORDER — TRANEXAMIC ACID-NACL 1000-0.7 MG/100ML-% IV SOLN
1000.0000 mg | INTRAVENOUS | Status: AC
  Administered 2023-05-29: 1000 mg via INTRAVENOUS
  Filled 2023-05-29: qty 100

## 2023-05-29 MED ORDER — BUPIVACAINE-EPINEPHRINE 0.25% -1:200000 IJ SOLN
INTRAMUSCULAR | Status: DC | PRN
  Administered 2023-05-29: 30 mL

## 2023-05-29 MED ORDER — BUPIVACAINE LIPOSOME 1.3 % IJ SUSP
INTRAMUSCULAR | Status: AC
  Filled 2023-05-29: qty 20

## 2023-05-29 MED ORDER — TIZANIDINE HCL 2 MG PO TABS
2.0000 mg | ORAL_TABLET | Freq: Four times a day (QID) | ORAL | 0 refills | Status: DC | PRN
  Filled 2023-05-29: qty 60, 15d supply, fill #0

## 2023-05-29 MED ORDER — LACTATED RINGERS IV SOLN: INTRAVENOUS | Status: DC

## 2023-05-29 MED ORDER — BUPIVACAINE LIPOSOME 1.3 % IJ SUSP: 10.0000 mL | Freq: Once | INTRAMUSCULAR | Status: DC

## 2023-05-29 MED ORDER — SENNOSIDES 8.6 MG PO TABS: 1.0000 | ORAL_TABLET | ORAL | Status: DC | PRN

## 2023-05-29 MED ORDER — ACETAMINOPHEN 325 MG PO TABS: 325.0000 mg | ORAL_TABLET | Freq: Four times a day (QID) | ORAL | Status: DC | PRN

## 2023-05-29 MED ORDER — PROPOFOL 500 MG/50ML IV EMUL
INTRAVENOUS | Status: AC
  Filled 2023-05-29: qty 50

## 2023-05-29 MED ORDER — ACETAMINOPHEN 10 MG/ML IV SOLN: 1000.0000 mg | Freq: Once | INTRAVENOUS | Status: DC | PRN

## 2023-05-29 MED ORDER — ONDANSETRON HCL 4 MG/2ML IJ SOLN
INTRAMUSCULAR | Status: AC
  Filled 2023-05-29: qty 2

## 2023-05-29 MED ORDER — ONDANSETRON HCL 4 MG/2ML IJ SOLN
INTRAMUSCULAR | Status: DC | PRN
  Administered 2023-05-29: 4 mg via INTRAVENOUS

## 2023-05-29 MED ORDER — OXYCODONE-ACETAMINOPHEN 5-325 MG PO TABS
1.0000 | ORAL_TABLET | ORAL | 0 refills | Status: DC | PRN
  Filled 2023-05-29: qty 30, 5d supply, fill #0

## 2023-05-29 MED ORDER — ACETAMINOPHEN 10 MG/ML IV SOLN
INTRAVENOUS | Status: AC
  Administered 2023-05-29: 1000 mg via INTRAVENOUS
  Filled 2023-05-29: qty 100

## 2023-05-29 MED ORDER — BUPIVACAINE LIPOSOME 1.3 % IJ SUSP
INTRAMUSCULAR | Status: AC
  Filled 2023-05-29: qty 10

## 2023-05-29 MED ORDER — TRANEXAMIC ACID 1000 MG/10ML IV SOLN
2000.0000 mg | Freq: Once | INTRAVENOUS | Status: AC
  Administered 2023-05-29: 2000 mg via TOPICAL
  Filled 2023-05-29: qty 20

## 2023-05-29 MED ORDER — METHOCARBAMOL 1000 MG/10ML IJ SOLN: 500.0000 mg | Freq: Four times a day (QID) | INTRAVENOUS | Status: DC | PRN

## 2023-05-29 MED ORDER — METOCLOPRAMIDE HCL 5 MG/ML IJ SOLN: 5.0000 mg | Freq: Three times a day (TID) | INTRAMUSCULAR | Status: DC | PRN

## 2023-05-29 MED ORDER — SODIUM CHLORIDE (PF) 0.9 % IJ SOLN
INTRAMUSCULAR | Status: AC
  Filled 2023-05-29: qty 30

## 2023-05-29 MED ORDER — PROPOFOL 10 MG/ML IV BOLUS
INTRAVENOUS | Status: AC
  Filled 2023-05-29: qty 20

## 2023-05-29 MED ORDER — BUPIVACAINE LIPOSOME 1.3 % IJ SUSP
INTRAMUSCULAR | Status: DC | PRN
  Administered 2023-05-29: 10 mL

## 2023-05-29 MED ORDER — POVIDONE-IODINE 10 % EX SWAB: 2.0000 | Freq: Once | CUTANEOUS | Status: DC

## 2023-05-29 MED ORDER — LIDOCAINE HCL (PF) 2 % IJ SOLN
INTRAMUSCULAR | Status: AC
  Filled 2023-05-29: qty 5

## 2023-05-29 MED ORDER — METOCLOPRAMIDE HCL 5 MG PO TABS: 5.0000 mg | ORAL_TABLET | Freq: Three times a day (TID) | ORAL | Status: DC | PRN

## 2023-05-29 MED ORDER — ONDANSETRON HCL 4 MG/2ML IJ SOLN: 4.0000 mg | Freq: Once | INTRAMUSCULAR | Status: DC | PRN

## 2023-05-29 MED ORDER — OXYCODONE HCL 5 MG PO TABS: 10.0000 mg | ORAL_TABLET | ORAL | Status: DC | PRN

## 2023-05-29 MED ORDER — LEVOTHYROXINE SODIUM 100 MCG PO TABS
100.0000 ug | ORAL_TABLET | Freq: Every day | ORAL | Status: DC
  Administered 2023-05-30: 100 ug via ORAL
  Filled 2023-05-29: qty 1

## 2023-05-29 MED ORDER — PROPOFOL 10 MG/ML IV BOLUS
INTRAVENOUS | Status: DC | PRN
  Administered 2023-05-29: 20 mg via INTRAVENOUS

## 2023-05-29 MED ORDER — OXYCODONE HCL 5 MG PO TABS: 5.0000 mg | ORAL_TABLET | ORAL | Status: DC | PRN

## 2023-05-29 MED ORDER — FENTANYL CITRATE (PF) 100 MCG/2ML IJ SOLN
INTRAMUSCULAR | Status: AC
  Filled 2023-05-29: qty 2

## 2023-05-29 MED ORDER — METHOCARBAMOL 500 MG PO TABS: 500.0000 mg | ORAL_TABLET | Freq: Four times a day (QID) | ORAL | Status: DC | PRN

## 2023-05-29 MED ORDER — BUPIVACAINE IN DEXTROSE 0.75-8.25 % IT SOLN
INTRATHECAL | Status: DC | PRN
  Administered 2023-05-29: 1.8 mL via INTRATHECAL

## 2023-05-29 MED ORDER — DIPHENHYDRAMINE HCL 12.5 MG/5ML PO ELIX: 12.5000 mg | ORAL_SOLUTION | ORAL | Status: DC | PRN

## 2023-05-29 MED ORDER — TRANEXAMIC ACID-NACL 1000-0.7 MG/100ML-% IV SOLN
1000.0000 mg | Freq: Once | INTRAVENOUS | Status: AC
  Administered 2023-05-29: 1000 mg via INTRAVENOUS
  Filled 2023-05-29: qty 100

## 2023-05-29 MED ORDER — DEXAMETHASONE SODIUM PHOSPHATE 10 MG/ML IJ SOLN
INTRAMUSCULAR | Status: AC
  Filled 2023-05-29: qty 1

## 2023-05-29 MED ORDER — FLEET ENEMA 7-19 GM/118ML RE ENEM: 1.0000 | ENEMA | Freq: Once | RECTAL | Status: DC | PRN

## 2023-05-29 SURGICAL SUPPLY — 58 items
APL PRP STRL LF DISP 70% ISPRP (MISCELLANEOUS) ×1
BAG COUNTER SPONGE SURGICOUNT (BAG) IMPLANT
BAG DECANTER FOR FLEXI CONT (MISCELLANEOUS) ×1 IMPLANT
BAG SPNG CNTER NS LX DISP (BAG)
BLADE SAW SGTL 73X25 THK (BLADE) IMPLANT
CHLORAPREP W/TINT 26 (MISCELLANEOUS) ×1 IMPLANT
COVER SURGICAL LIGHT HANDLE (MISCELLANEOUS) ×1 IMPLANT
DRAPE C-ARM 42X120 X-RAY (DRAPES) IMPLANT
DRAPE C-ARMOR (DRAPES) IMPLANT
DRAPE ORTHO SPLIT 77X108 STRL (DRAPES) ×4
DRAPE SHEET LG 3/4 BI-LAMINATE (DRAPES) ×1 IMPLANT
DRAPE SURG ORHT 6 SPLT 77X108 (DRAPES) ×2 IMPLANT
DRAPE U-SHAPE 47X51 STRL (DRAPES) ×1 IMPLANT
DRSG AQUACEL AG ADV 3.5X10 (GAUZE/BANDAGES/DRESSINGS) ×1 IMPLANT
ELECT BLADE TIP CTD 4 INCH (ELECTRODE) ×1 IMPLANT
ELECT REM PT RETURN 15FT ADLT (MISCELLANEOUS) ×1 IMPLANT
FEMORAL HEAD LFIT (Head) ×1 IMPLANT
GAUZE SPONGE 4X4 12PLY STRL (GAUZE/BANDAGES/DRESSINGS) IMPLANT
GAUZE XEROFORM 5X9 LF (GAUZE/BANDAGES/DRESSINGS) IMPLANT
GLOVE BIO SURGEON STRL SZ7.5 (GLOVE) ×1 IMPLANT
GLOVE BIO SURGEON STRL SZ8.5 (GLOVE) ×1 IMPLANT
GLOVE BIOGEL PI IND STRL 8 (GLOVE) ×1 IMPLANT
GLOVE BIOGEL PI IND STRL 9 (GLOVE) ×1 IMPLANT
GOWN STRL SURGICAL XL XLNG (GOWN DISPOSABLE) ×2 IMPLANT
HEAD FEM 5XOFST TPR 36X (Head) IMPLANT
HOLDER FOLEY CATH W/STRAP (MISCELLANEOUS) ×1 IMPLANT
HOOD PEEL AWAY T7 (MISCELLANEOUS) ×4 IMPLANT
IMMOBILIZER KNEE 20 (SOFTGOODS)
IMMOBILIZER KNEE 20 THIGH 36 (SOFTGOODS) IMPLANT
IV NS IRRIG 3000ML ARTHROMATIC (IV SOLUTION) ×1 IMPLANT
KIT BASIN OR (CUSTOM PROCEDURE TRAY) ×1 IMPLANT
KIT TURNOVER KIT A (KITS) IMPLANT
LINER ACETAB HIP 10D 36MM E (Liner) IMPLANT
LINER HIP ACETAB 10D 36MM E (Liner) ×1 IMPLANT
NDL HYPO 21X1.5 SAFETY (NEEDLE) ×1 IMPLANT
NDL MAYO CATGUT SZ4 TPR NDL (NEEDLE) IMPLANT
NDL SAFETY ECLIP 18X1.5 (MISCELLANEOUS) ×1 IMPLANT
NEEDLE HYPO 21X1.5 SAFETY (NEEDLE) ×2 IMPLANT
NEEDLE MAYO CATGUT SZ4 (NEEDLE) IMPLANT
NS IRRIG 1000ML POUR BTL (IV SOLUTION) ×1 IMPLANT
PACK TOTAL JOINT (CUSTOM PROCEDURE TRAY) ×1 IMPLANT
PASSER SUT SWANSON 36MM LOOP (INSTRUMENTS) IMPLANT
PROTECTOR NERVE ULNAR (MISCELLANEOUS) ×1 IMPLANT
SPIKE FLUID TRANSFER (MISCELLANEOUS) ×2 IMPLANT
SUT ETHIBOND 2 V 37 (SUTURE) ×3 IMPLANT
SUT VIC AB 0 CT1 36 (SUTURE) ×1 IMPLANT
SUT VIC AB 1 CTX 36 (SUTURE) ×2
SUT VIC AB 1 CTX36XBRD ANBCTR (SUTURE) ×1 IMPLANT
SUT VICRYL+ 3-0 36IN CT-1 (SUTURE) ×1 IMPLANT
SWAB COLLECTION DEVICE MRSA (MISCELLANEOUS) IMPLANT
SWAB CULTURE ESWAB REG 1ML (MISCELLANEOUS) IMPLANT
SYR CONTROL 10ML LL (SYRINGE) ×2 IMPLANT
TOWEL OR 17X26 10 PK STRL BLUE (TOWEL DISPOSABLE) ×1 IMPLANT
TOWEL OR NON WOVEN STRL DISP B (DISPOSABLE) ×1 IMPLANT
TOWER CARTRIDGE SMART MIX (DISPOSABLE) IMPLANT
TRAY FOLEY MTR SLVR 14FR STAT (SET/KITS/TRAYS/PACK) ×1 IMPLANT
TUBE SUCTION HIGH CAP CLEAR NV (SUCTIONS) ×1 IMPLANT
WATER STERILE IRR 1000ML POUR (IV SOLUTION) ×2 IMPLANT

## 2023-05-29 NOTE — Discharge Instructions (Signed)

## 2023-05-29 NOTE — Anesthesia Procedure Notes (Signed)
Spinal  Patient location during procedure: OR Start time: 05/29/2023 10:20 AM Reason for block: surgical anesthesia Staffing Performed: resident/CRNA  Anesthesiologist: Collene Schlichter, MD Resident/CRNA: Sindy Guadeloupe, CRNA Performed by: Sindy Guadeloupe, CRNA Authorized by: Collene Schlichter, MD   Preanesthetic Checklist Completed: patient identified, IV checked, site marked, risks and benefits discussed, surgical consent, monitors and equipment checked, pre-op evaluation and timeout performed Spinal Block Patient position: sitting Prep: DuraPrep and site prepped and draped Patient monitoring: heart rate, cardiac monitor, continuous pulse ox and blood pressure Approach: midline Location: L3-4 Injection technique: single-shot Needle Needle type: Sprotte and Pencan  Needle gauge: 24 G Needle length: 10 cm Assessment Events: CSF return Additional Notes Pt placed in sitting position, spinal kit expiration date checked and verified, + CSF, - heme, pt tolerated well. Dr Desmond Lope present and supervising throughout SAB placement. Adequate sensory level.

## 2023-05-29 NOTE — Anesthesia Procedure Notes (Signed)
Procedure Name: MAC Date/Time: 05/29/2023 10:00 AM  Performed by: Sindy Guadeloupe, CRNAPre-anesthesia Checklist: Patient identified, Emergency Drugs available, Suction available, Patient being monitored and Timeout performed Oxygen Delivery Method: Simple face mask Placement Confirmation: positive ETCO2

## 2023-05-29 NOTE — Op Note (Signed)
PATIENT ID:      Alexis Stevenson  MRN:     [id number] DOB/AGE:    87-08-1934 / 89 y.o.       OPERATIVE REPORT    DATE OF PROCEDURE:  05/29/2023       PREOPERATIVE DIAGNOSIS:  MALPOSITIONED RIGHT HIP ACETABULUM                                                       Estimated body mass index is 32.61 kg/m as calculated from the following:   Height as of this encounter: 5\' 4"  (1.626 m).   Weight as of this encounter: 86.2 kg.     POSTOPERATIVE DIAGNOSIS:  MALPOSITIONED RIGHT HIP ACETABULUM, 8 mm short leg length discrepancy.                                                          PROCEDURE: Revision right total hip arthroplasty with removal of -5 x 36 mm femoral head, removal of 0 degree polyethylene liner inside of the 52 mm Stryker PSL shell.  We revised to a +5 x 36 mm metal femoral head, Stryker E 10 degree polyethylene liner index straight posterior inside of a 52 mm PSL shell.  We also removed a single dome screw from the shell.   SURGEON: Nestor Lewandowsky    ASSISTANT:   Tomi Likens. [employer]  (present throughout entire procedure and necessary for timely completion of the procedure)  ANESTHESIA: Spinal BLOOD LOSS: 300 cc FLUID REPLACEMENT: 1800 cc crystalloid Tranexamic Acid: 1 g IV, 2 g topical DRAINS: None COMPLICATIONS: None    INDICATIONS FOR PROCEDURE: S/p primary right total hip arthroplasty 8 years ago on aspirin [place].  The right hip is 8 mm short and the cup is flat.  1 year ago the patient developed recurrent subluxations of the right total hip posteriorly.  She has never had a true dislocation but has significant apprehension when she goes from sitting to standing.  Radiographs showed the above findings.  No fevers or chills or wound problems.  She has not had a back fusion.  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  To decrease pain and apprehension revision of the acetabular component, and a longer femoral head have been discussed with the patient.  The risks, benefits, and  alternatives were discussed at length including but not limited to the risks of infection, bleeding, nerve injury, stiffness, blood clots, the need for revision surgery, cardiopulmonary complications, among others, and they were willing to proceed.Benefits have been discussed. Questions answered.     PROCEDURE IN DETAIL: The patient was identified by armband,  received preoperative IV antibiotics in the holding area at [facility], taken to the operating room , appropriate anesthetic monitors  were attached and general endotracheal anesthesia induced. Foley catheter was inserted. Patient was rolled into the left lateral decubitus position and fixed there with a Stulberg Mark II pelvic clamp and the right lower extremity was then prepped and draped  in the usual sterile fashion from the ankle to the hemipelvis. A time-out  procedure was performed. The skin along the lateral hip and thigh  infiltrated with 10 mL of 0.5% Marcaine and epinephrine solution. We  then made a posterolateral approach to the hip. With a #10 blade, 15 cm  incision through skin and subcutaneous tissue down to the level of the  IT band. Small bleeders were identified and cauterized. IT band cut in  line with skin incision exposing the greater trochanter. A Cobra retractor was placed between the gluteus minimus and the superior hip joint capsule, and a spiked Cobra between the quadratus femoris and the inferior hip joint capsule. This isolated the short  external rotators and piriformis tendons. These were tagged with a #2 Ethibond  suture and cut off their insertion on the intertrochanteric crest. The posterior  capsule was then developed into an acetabular-based flap from Posterior Superior off of the acetabulum out over the femoral neck and back posterior inferior to the acetabular rim. This flap was tagged with two #2 Ethibond sutures and retracted protecting the sciatic nerve.  This exposed the neck of the femoral  component as well as the acetabular shell liner and the hip all.  The hip was flexed to 90 degrees and internally rotated dislocating the stem.  Using a metal drift and mallet we removed the -5 x 36 mm femoral head.  We are then able to tuck the trunnion anterior and superior to the acetabular component.  Scar tissue was removed and we then extracted the 0 degree polyethylene liner from the 52 mm shell.  The cup was noted to be flat.  We inserted a 10 degree polyethylene liner and x-ray posterior to except a +5 x 36 mm trial head.  The hip was again reduced tension was found to be excellent and stability to 90 degrees with 60 of internal rotation was noted.  At this point the trial components removed.  Using the torque screwdriver from the Stryker hip set we remove the single dome screw the wound was irrigated out with normal saline solution packed with a TXA sponge to help control hemostasis.  In the soft tissues were injected with TXA using a 22-gauge by 1 and half inch needle.  At this point we inserted the real Stryker EE 10 degree liner with the index tray posteriorly we then hammered into place a +5 x 36 head and reduced the hip again.  Stability was checked and found to be excellent.. The wound was once again thoroughly irrigated out with normal saline solution pulse lavage. The  capsular flap and short external rotators were repaired back to the  intertrochanteric crest through drill holes with a #2 Ethibond suture.  The IT band was closed with running 1 Vicryl suture. The subcutaneous  tissue with 0 and 2-0 undyed Vicryl suture and the skin with running  3-0 Vivryl SQ suture. Aquacil dessing was applied. The patient was then unclamped, rolled supine, awaken extubated and taken to recovery room without difficulty in stable condition.   Nestor Lewandowsky 05/29/2023, 12:08 PM

## 2023-05-29 NOTE — Progress Notes (Signed)
Orthopedic Tech Progress Note Patient Details:  Alexis Stevenson April 25, 1934 [id number]  Patient ID: Alexis Stevenson, female   DOB: 22-Aug-1934, 89 y.o.   MRN: [id number] Patient doesn't meet criteria for ohf. Patient must be under 70 to get ohf. Trinna Post 05/29/2023, 7:28 PM

## 2023-05-29 NOTE — Transfer of Care (Signed)
Immediate Anesthesia Transfer of Care Note  Patient: Alexis Stevenson  Procedure(s) Performed: RIGHT TOTAL HIP REVISION POSTERIOR APPROACH (Right: Hip)  Patient Location: PACU  Anesthesia Type:Spinal  Level of Consciousness: awake, alert , and patient cooperative  Airway & Oxygen Therapy: Patient Spontanous Breathing and Patient connected to face mask oxygen  Post-op Assessment: Report given to RN and Post -op Vital signs reviewed and stable  Post vital signs: Reviewed and stable  Last Vitals:  Vitals Value Taken Time  BP 101/47 05/29/23 1237  Temp    Pulse 69 05/29/23 1240  Resp 16 05/29/23 1240  SpO2 99 % 05/29/23 1240  Vitals shown include unfiled device data.  Last Pain:  Vitals:   05/29/23 0723  TempSrc:   PainSc: 0-No pain         Complications: No notable events documented.

## 2023-05-29 NOTE — Evaluation (Signed)
Physical Therapy Evaluation Patient Details Name: Alexis Stevenson MRN: [id number] DOB: 05/31/1934 Today's Date: 05/29/2023  History of Present Illness  89 yo female presents to therapy s/p R total hip revision, pt underwent posterior lateral approach R THA, posterior lateral approach on 05/29/2023 due to failure of conservative measures. Pt is currently R LE WBAT with posterior hip precautions. Pt PMH includes but is not limited to: anemia, arteriovenous malformation of small intestines, HTN, HA, HDL, hypothyroidism, CVA, chronic imbalance, c-spine sx, R TKA x 2 and B THA.  Clinical Impression    Alexis Stevenson is a 89 y.o. female POD 0 s/p R THA. Patient reports mod I with mobility at baseline. Patient is now limited by functional impairments (see PT problem list below) and requires min A for bed mobility and min guard and cues for transfers. Patient was able to ambulate 45 feet with RW and min guard level of assist. Patient instructed in exercise to facilitate ROM and circulation to manage edema. Patient will benefit from continued skilled PT interventions to address impairments and progress towards PLOF. Acute PT will follow to progress mobility and stair training in preparation for safe discharge home with family support and [facility] services.       Assistance Recommended at Discharge Intermittent Supervision/Assistance  If plan is discharge home, recommend the following:  Can travel by private vehicle  A little help with walking and/or transfers;A little help with bathing/dressing/bathroom;Assistance with cooking/housework;Help with stairs or ramp for entrance;Assist for transportation        Equipment Recommendations None recommended by PT (pt has DME in home setting)  Recommendations for Other Services       Functional Status Assessment Patient has had a recent decline in their functional status and demonstrates the ability to make significant improvements in function in a reasonable  and predictable amount of time.     Precautions / Restrictions Precautions Precautions: Posterior Hip;Fall Restrictions Weight Bearing Restrictions: No      Mobility  Bed Mobility Overal bed mobility: Needs Assistance Bed Mobility: Supine to Sit     Supine to sit: Min assist, HOB elevated     General bed mobility comments: min A for R LE    Transfers Overall transfer level: Needs assistance Equipment used: Rolling walker (2 wheels) Transfers: Sit to/from Stand Sit to Stand: Min guard           General transfer comment: cues for proper UE placement, pt c/o R hand discomfort with IV placement    Ambulation/Gait Ambulation/Gait assistance: Min guard Gait Distance (Feet): 45 Feet Assistive device: Rolling walker (2 wheels) Gait Pattern/deviations: Step-to pattern, Trunk flexed, Antalgic Gait velocity: decreased     General Gait Details: lateral sway noted  Stairs            Wheelchair Mobility     Tilt Bed    Modified Rankin (Stroke Patients Only)       Balance                                             Pertinent Vitals/Pain Pain Assessment Pain Assessment: 0-10 Pain Score: 5  Pain Location: R hip Pain Descriptors / Indicators: Aching, Constant, Discomfort, Operative site guarding Pain Intervention(s): Limited activity within patient's tolerance, Monitored during session, Repositioned, Ice applied    Home Living Family/patient expects to be discharged to:: Private residence Living Arrangements:  Alone Available Help at Discharge: Family;Friend(s) Type of Home: House (townhome) Home Access: Stairs to enter Entrance Stairs-Rails: None Entrance Stairs-Number of Steps: 1 (small)   Home Layout: One level Home Equipment: Agricultural consultant (2 wheels);Standard Walker;Cane - single point      Prior Function Prior Level of Function : Independent/Modified Independent             Mobility Comments: mod I with SPC for ADLs,  self care tasks, IADLs       Hand Dominance        Extremity/Trunk Assessment        Lower Extremity Assessment Lower Extremity Assessment: RLE deficits/detail RLE Deficits / Details: ankle DF/PF 5/5 RLE Sensation: WNL    Cervical / Trunk Assessment Cervical / Trunk Assessment:  (slight head forward)  Communication   Communication: No difficulties  Cognition Arousal/Alertness: Awake/alert Behavior During Therapy: WFL for tasks assessed/performed Overall Cognitive Status: Within Functional Limits for tasks assessed                                          General Comments      Exercises Total Joint Exercises Ankle Circles/Pumps: AROM, Both, 20 reps   Assessment/Plan    PT Assessment Patient needs continued PT services  PT Problem List Decreased strength;Decreased range of motion;Decreased activity tolerance;Decreased balance;Decreased mobility;Decreased coordination;Pain       PT Treatment Interventions DME instruction;Gait training;Stair training;Functional mobility training;Therapeutic activities;Therapeutic exercise;Balance training;Neuromuscular re-education;Patient/family education;Modalities    PT Goals (Current goals can be found in the Care Plan section)  Acute Rehab PT Goals Patient Stated Goal: to be more stable and not feel like my R hip is going to give out PT Goal Formulation: With patient Time For Goal Achievement: 06/12/23 Potential to Achieve Goals: Good    Frequency 7X/week     Co-evaluation               AM-PAC PT "6 Clicks" Mobility  Outcome Measure Help needed turning from your back to your side while in a flat bed without using bedrails?: A Little Help needed moving from lying on your back to sitting on the side of a flat bed without using bedrails?: A Little Help needed moving to and from a bed to a chair (including a wheelchair)?: A Little Help needed standing up from a chair using your arms (e.g., wheelchair  or bedside chair)?: A Little Help needed to walk in hospital room?: A Little Help needed climbing 3-5 steps with a railing? : A Lot 6 Click Score: 17    End of Session Equipment Utilized During Treatment: Gait belt       PT Visit Diagnosis: Unsteadiness on feet (R26.81);Other abnormalities of gait and mobility (R26.89);Muscle weakness (generalized) (M62.81);Difficulty in walking, not elsewhere classified (R26.2);Pain Pain - Right/Left: Right Pain - part of body: Hip;Leg    Time: 8119-1478 PT Time Calculation (min) (ACUTE ONLY): 31 min   Charges:   PT Evaluation $PT Eval Low Complexity: 1 Low PT Treatments $Gait Training: 8-22 mins PT General Charges $$ ACUTE PT VISIT: 1 Visit         Johnny Bridge, PT Acute Rehab   Jacqualyn Posey 05/29/2023, 5:48 PM

## 2023-05-30 ENCOUNTER — Other Ambulatory Visit (HOSPITAL_COMMUNITY): Payer: Self-pay

## 2023-05-30 ENCOUNTER — Encounter (HOSPITAL_COMMUNITY): Payer: Self-pay | Admitting: Orthopedic Surgery

## 2023-05-30 NOTE — Anesthesia Postprocedure Evaluation (Signed)
Anesthesia Post Note  Patient: Alexis Stevenson, clinical  Procedure(s) Performed: RIGHT TOTAL HIP REVISION POSTERIOR APPROACH (Right: Hip)     Patient location during evaluation: PACU Anesthesia Type: Spinal Level of consciousness: awake, awake and alert and oriented Pain management: pain level controlled Vital Signs Assessment: post-procedure vital signs reviewed and stable Respiratory status: spontaneous breathing, nonlabored ventilation and respiratory function stable Cardiovascular status: blood pressure returned to baseline and stable Postop Assessment: no headache, no backache, spinal receding and no apparent nausea or vomiting Anesthetic complications: no   No notable events documented.  Last Vitals:  Vitals:   05/30/23 0255 05/30/23 0644  BP: (!) 131/47 (!) 130/52  Pulse: 73 99  Resp: 18 18  Temp: 36.6 C 36.8 C  SpO2: 97% 96%    Last Pain:  Vitals:   05/30/23 0644  TempSrc: Oral  PainSc:                  Collene Schlichter

## 2023-05-30 NOTE — Progress Notes (Signed)
Patient discharged to home w/ daughter. Given all belongings, instructions, medications from pharmacy, home equipment.. Verbalized understanding of instructions. Escorted to pov via w/c.

## 2023-05-30 NOTE — Discharge Summary (Signed)
Patient ID: Alexis Stevenson MRN: [id number] DOB/AGE: Jan 06, 1934 89 y.o.  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 female who was admitted 05/29/2023 for operative treatment ofInstability of prosthesis of right hip joint (HCC). Patient has severe unremitting pain that affects sleep, daily activities, and work/hobbies. After pre-op clearance the patient was taken to the operating room on 05/29/2023 and underwent  Procedure(s): RIGHT TOTAL HIP REVISION POSTERIOR APPROACH.    Patient was given perioperative antibiotics:  Anti-infectives (From admission, onward)    Start     Dose/Rate Route Frequency Ordered Stop   05/29/23 0730  ceFAZolin (ANCEF) IVPB 2g/100 mL premix        2 g 200 mL/hr over 30 Minutes Intravenous On call to O.R. 05/29/23 0719 05/29/23 1039        Patient was given sequential compression devices, early ambulation, and chemoprophylaxis to prevent DVT.  Patient benefited maximally from hospital stay and there were no complications.    Recent vital signs: Patient Vitals for the past 24 hrs:  BP Temp Temp src Pulse Resp SpO2  05/30/23 0928 (!) 143/52 98.3 F (36.8 C) -- 76 17 98 %  05/30/23 0644 (!) 130/52 98.3 F (36.8 C) Oral 99 18 96 %  05/30/23 0255 (!) 131/47 97.8 F (36.6 C) -- 73 18 97 %  05/29/23 2303 (!) 124/55  97.7 F (36.5 C) Oral 69 18 96 %  05/29/23 1627 133/63 97.7 F (36.5 C) Oral 72 15 97 %  05/29/23 1429 (!) 107/95 (!) 97.5 F (36.4 C) -- 69 20 97 %  05/29/23 1400 138/62 -- -- 62 14 100 %  05/29/23 1345 134/66 (!) 97.5 F (36.4 C) -- 62 12 100 %  05/29/23 1330 124/65 -- -- 62 (!) 9 100 %  05/29/23 1315 115/62 -- -- 65 20 100 %  05/29/23 1300 (!) 141/106 -- -- 67 17 96 %  05/29/23 1245 (!) 135/58 -- -- 69 (!) 21 98 %  05/29/23 1237 (!) 101/47 (!) 97.5 F (36.4 C) -- 73 16 100 %     Recent laboratory studies: No results for input(s): "WBC", "HGB", "HCT", "PLT", "NA", "K", "CL", "CO2", "BUN", "CREATININE", "GLUCOSE", "INR", "CALCIUM" in the last 72 hours.  Invalid input(s): "PT", "2"   Discharge Medications:   Allergies as of 05/30/2023       Reactions   Codeine Nausea And Vomiting   Effexor Xr [venlafaxine Hcl]    intolerant   Furosemide    intolerant   Gabapentin    Lidocaine Hypertension   Loratadine Hives   Naproxen Hypertension   All Nsaids   Nsaids    Penicillin G Other (See Comments)   Mouth raw and yeast infection   Plendil [felodipine] Other (See Comments)   constipation   Spiriva  Respimat [tiotropium Bromide Monohydrate]    Heart race   Tramadol Nausea Only        Medication List     TAKE these medications    aspirin EC 81 MG tablet Take 1 tablet (81 mg total) by mouth 2 (two) times daily.   Centrum Silver 50+Women Tabs Take 1 tablet by mouth daily.   Cholecalciferol 50 MCG (2000 UT) Tabs Take 2,000 Units by mouth daily.   cyanocobalamin 1000 MCG tablet Take 1,000 mcg by mouth daily.   diphenhydramine-acetaminophen 25-500 MG Tabs tablet Commonly known as: TYLENOL PM Take 1 tablet by mouth at bedtime. Nightly as needed   ferrous sulfate 325 (65 FE) MG tablet Take 325 mg by mouth daily.   levothyroxine 100 MCG tablet Commonly known as: SYNTHROID Take 100 mcg by mouth daily before breakfast.   olmesartan 20 MG tablet Commonly known as:  BENICAR Take 20 mg by mouth daily.   omeprazole 20 MG capsule Commonly known as: PRILOSEC Take 20 mg by mouth daily.   oxyCODONE-acetaminophen 5-325 MG tablet Commonly known as: PERCOCET/ROXICET Take 1 tablet by mouth every 4 (four) hours as needed for severe pain.   rosuvastatin 20 MG tablet Commonly known as: CRESTOR Take 20 mg by mouth daily.   senna 8.6 MG tablet Commonly known as: SENOKOT Take 1 tablet by mouth as needed for constipation.   tiZANidine 2 MG tablet Commonly known as: ZANAFLEX Take 1 tablet (2 mg total) by mouth every 6 (six) hours as needed for muscle spasms.   Vitamin D (Ergocalciferol) 1.25 MG (50000 UNIT) Caps capsule Commonly known as: DRISDOL Take 50,000 Units by mouth 2 (two) times a week.               Durable Medical Equipment  (From admission, onward)           Start     Ordered   05/29/23 1501  DME Walker rolling  Once       Question:  Patient needs a walker to treat with the following condition  Answer:  Status post right hip replacement   05/29/23 1500   05/29/23 1501  DME 3 n 1  Once        05/29/23 1500              Discharge Care Instructions  (From admission, onward)           Start     Ordered   05/30/23 0000  Weight bearing as tolerated        05/30/23 1151            Diagnostic Studies: DG Hip Port Unilat With Pelvis 1V Right  Result Date: 05/29/2023 CLINICAL DATA:  Postop right hip EXAM: DG HIP (WITH OR WITHOUT PELVIS) 1V PORT RIGHT COMPARISON:  CT pelvis 04/14/2023 FINDINGS: Bilateral hip replacements with revision on the right. Intact hardware. Normal alignment. No fracture. Gas in the right hip soft tissues consistent with recent surgery IMPRESSION: Status post right hip replacement with expected postsurgical change. Electronically Signed   By: Jasmine Pang M.D.   On: 05/29/2023 16:29   DG Chest 2 View  Result Date: 05/21/2023 CLINICAL DATA:  Preop exam.  Preop for right hip surgery. EXAM: CHEST  - 2 VIEW COMPARISON:  Radiograph 01/18/2023 FINDINGS: The heart is normal in size. Stable mediastinal contours. Aortic atherosclerosis. Chronic eventration of left hemidiaphragm with gaseous gastric distension, unchanged. No focal airspace disease, pulmonary edema, pleural effusion or pneumothorax. No acute osseous  abnormalities are seen. IMPRESSION: 1. No acute chest findings. 2. Chronic eventration of left hemidiaphragm. Electronically Signed   By: Narda Rutherford M.D.   On: 05/21/2023 10:57    Disposition: Discharge disposition: 01-Home or Self Care       Discharge Instructions     Call MD / Call 911   Complete by: As directed    If you experience chest pain or shortness of breath, CALL 911 and be transported to the hospital emergency room.  If you develope a fever above 101 F, pus (white drainage) or increased drainage or redness at the wound, or calf pain, call your surgeon's office.   Constipation Prevention   Complete by: As directed    Drink plenty of fluids.  Prune juice may be helpful.  You may use a stool softener, such as Colace (over the counter) 100 mg twice a day.  Use MiraLax (over the counter) for constipation as needed.   Diet - low sodium heart healthy   Complete by: As directed    Driving restrictions   Complete by: As directed    No driving for 2 weeks   Increase activity slowly as tolerated   Complete by: As directed    Patient may shower   Complete by: As directed    You may shower without a dressing once there is no drainage.  Do not wash over the wound.  If drainage remains, cover wound with plastic wrap and then shower.   Post-operative opioid taper instructions:   Complete by: As directed    POST-OPERATIVE OPIOID TAPER INSTRUCTIONS: It is important to wean off of your opioid medication as soon as possible. If you do not need pain medication after your surgery it is ok to stop day one. Opioids include: Codeine, Hydrocodone(Norco, Vicodin),  Oxycodone(Percocet, oxycontin) and hydromorphone amongst others.  Long term and even short term use of opiods can cause: Increased pain response Dependence Constipation Depression Respiratory depression And more.  Withdrawal symptoms can include Flu like symptoms Nausea, vomiting And more Techniques to manage these symptoms Hydrate well Eat regular healthy meals Stay active Use relaxation techniques(deep breathing, meditating, yoga) Do Not substitute Alcohol to help with tapering If you have been on opioids for less than two weeks and do not have pain than it is ok to stop all together.  Plan to wean off of opioids This plan should start within one week post op of your joint replacement. Maintain the same interval or time between taking each dose and first decrease the dose.  Cut the total daily intake of opioids by one tablet each day Next start to increase the time between doses. The last dose that should be eliminated is the evening dose.      Weight bearing as tolerated   Complete by: As directed         Follow-up Information     Gean Birchwood, MD Follow up in 2 week(s).   Specialty: Orthopedic Surgery Contact information: [address] [place] [address] [phone]         [facility] [place] [place] Follow up.   Why: [facility] will provide PT in the home after discharge.                 Signed: Dannielle Burn 05/30/2023, 11:51 AM

## 2023-05-30 NOTE — Progress Notes (Signed)
Called to room by the patient.  She states she is "very upset because she hasn't gotten anything for pain."  I reminded her that she had been adamant since her arrival to the department that she did NOT want to take any pain medicine stronger than Tylenol. She responded affirmatively, agreeing. I reminded her she had gotten Tylenol at 2025 and told her it was time for her next dose.  She stated she "didn't want anything from (me)" because she thought she was getting Tylenol PM and, since it wasn't, she did not want to take any more.  I told her if she were to change her mind and decide to take more Tylenol to just push her call bell and let me know.  She stated, "No, thanks, I'm fine."

## 2023-05-30 NOTE — TOC Transition Note (Signed)
Transition of Care [facility]) - CM/SW Discharge Note  Patient Details  Name: Alexis Stevenson MRN: [id number] Date of Birth: [birth year]  [facility] CM/SW Contact:  Ewing Schlein, LCSW Phone Number: 05/30/2023, 11:37 AM  Clinical Narrative: Patient is expected to discharge home after working with PT. CSW met with patient to review discharge plan and needs. Patient has a rolling walker and cane at home, so there are no DME needs at this time. Patient will need HHPT. CSW made HHPT referral to [facility], which was accepted. CSW updated patient and daughter, Joyce Gross. TOC signing off.    Final next level of care: Home w Home Health Services Barriers to Discharge: No Barriers Identified  Patient Goals and CMS Choice CMS Medicare.gov Compare Post Acute Care list provided to:: Patient Choice offered to / list presented to : Patient  Discharge Plan and Services Additional resources added to the After Visit Summary for          DME Arranged: N/A DME Agency: NA HH Arranged: PT HH Agency: [facility] Date [facility] Agency Contacted: 05/30/23 Time HH Agency Contacted: 1050 Representative spoke with at [facility] Agency: Haywood Lasso  Social Determinants of Health (SDOH) Interventions SDOH Screenings   Food Insecurity: No Food Insecurity (05/29/2023)  Housing: Low Risk  (05/29/2023)  Transportation Needs: No Transportation Needs (05/29/2023)  Utilities: Not At Risk (05/29/2023)  Tobacco Use: Medium Risk (05/29/2023)   Readmission Risk Interventions     No data to display

## 2023-05-30 NOTE — Progress Notes (Signed)
Physical Therapy Treatment Patient Details Name: Alexis Stevenson MRN: [id number] DOB: 1934/01/25 Today's Date: 05/30/2023   History of Present Illness 89 yo female presents to therapy s/p R total hip revision, pt underwent posterior lateral approach R THA, posterior lateral approach on 05/29/2023 due to failure of conservative measures. Pt is currently R LE WBAT with posterior hip precautions. Pt PMH includes but is not limited to: anemia, arteriovenous malformation of small intestines, HTN, HA, HDL, hypothyroidism, CVA, chronic imbalance, c-spine sx, R TKA x 2 and B THA.    PT Comments  POD # 1  General Comments: AxO x 3 very pleasant Lady with mild ST memory defiect requiring repeat instructions esp pertaining to her THP. Assisted with amb in hallway Then returned to room to perform some TE's following HEP handout.  Instructed on proper tech, freq as well as use of ICE.   Daughter present during session for "Family Education" on safe handling will mobility and HEP.      Assistance Recommended at Discharge Intermittent Supervision/Assistance  If plan is discharge home, recommend the following:  Can travel by private vehicle    A little help with walking and/or transfers;A little help with bathing/dressing/bathroom;Assistance with cooking/housework;Help with stairs or ramp for entrance;Assist for transportation      Equipment Recommendations  None recommended by PT    Recommendations for Other Services       Precautions / Restrictions Precautions Precautions: Posterior Hip;Fall Precaution Booklet Issued: Yes (comment) Precaution Comments: pt only able to recall 1/3 THP so re educated Restrictions Weight Bearing Restrictions: No RLE Weight Bearing: Weight bearing as tolerated LLE Weight Bearing: Weight bearing as tolerated     Mobility  Bed Mobility               General bed mobility comments: OOB on recliner    Transfers Overall transfer level: Needs  assistance Equipment used: Rolling walker (2 wheels) Transfers: Sit to/from Stand Sit to Stand: Supervision, Min guard           General transfer comment: cues for proper UE placement and safety with turns    Ambulation/Gait Ambulation/Gait assistance: Supervision, Min guard Gait Distance (Feet): 65 Feet Assistive device: Rolling walker (2 wheels) Gait Pattern/deviations: Step-to pattern, Trunk flexed, Antalgic Gait velocity: decreased     General Gait Details: tolerated a functional distance.  Had Daughter "hands on" assist using safety belt.   Stairs             Wheelchair Mobility     Tilt Bed    Modified Rankin (Stroke Patients Only)       Balance                                            Cognition Arousal/Alertness: Awake/alert Behavior During Therapy: WFL for tasks assessed/performed Overall Cognitive Status: Within Functional Limits for tasks assessed                                 General Comments: AxO x 3 very pleasant Lady with mild ST memory defiect requiring repeat instructions esp pertaining to her THP.        Exercises  Total Hip Replacement TE's following HEP Handout 10 reps ankle pumps 05 reps knee presses 05 reps heel slides 05 reps SAQ's 05 reps ABD Instructed how  to use a belt loop to assist  Followed by ICE     General Comments        Pertinent Vitals/Pain Pain Assessment Pain Assessment: Faces Faces Pain Scale: Hurts a little bit Pain Location: R hip Pain Descriptors / Indicators: Aching, Constant, Discomfort, Operative site guarding Pain Intervention(s): Premedicated before session, Repositioned, Ice applied    Home Living                          Prior Function            PT Goals (current goals can now be found in the care plan section)      Frequency    7X/week      PT Plan Current plan remains appropriate    Co-evaluation              AM-PAC  PT "6 Clicks" Mobility   Outcome Measure  Help needed turning from your back to your side while in a flat bed without using bedrails?: A Little Help needed moving from lying on your back to sitting on the side of a flat bed without using bedrails?: A Little Help needed moving to and from a bed to a chair (including a wheelchair)?: A Little Help needed standing up from a chair using your arms (e.g., wheelchair or bedside chair)?: A Little Help needed to walk in hospital room?: A Little Help needed climbing 3-5 steps with a railing? : A Little 6 Click Score: 18    End of Session Equipment Utilized During Treatment: Gait belt Activity Tolerance: Patient tolerated treatment well Patient left: in chair;with call bell/phone within reach;with chair alarm set;with family/visitor present Nurse Communication: Mobility status PT Visit Diagnosis: Unsteadiness on feet (R26.81);Other abnormalities of gait and mobility (R26.89);Muscle weakness (generalized) (M62.81);Difficulty in walking, not elsewhere classified (R26.2);Pain Pain - Right/Left: Right Pain - part of body: Hip;Leg     Time: 2841-3244 PT Time Calculation (min) (ACUTE ONLY): 31 min  Charges:    $Gait Training: 8-22 mins $Therapeutic Exercise: 8-22 mins PT General Charges $$ ACUTE PT VISIT: 1 Visit                     Felecia Shelling  PTA Acute  Rehabilitation Services Office M-F          [phone]

## 2023-05-30 NOTE — Progress Notes (Signed)
The patient's call light came on.  I went directly to her room. Her IV had been pulled out.  The patient was a bit frantic, repeatedly stating, " There's blood everywhere and I don't know where it is coming from."  I tried reassuring her, telling her she was alright, that it had come from where her IV had been. She still was unable to understand where the blood had come from.  Alondra, NT, and I, cleaned the blood from her hands and arms, and changed her gown and linens. Reassurance was given once again.  The patient stated she was worried that she might start bleeding again. I assured her she wouldn't. The call light was placed by her side and reviewed. I encouraged her to call if she had any concerns, questions, or needed anything at all.

## 2023-05-30 NOTE — Progress Notes (Signed)
PATIENT ID: Alexis Stevenson  MRN: [id number]  DOB/AGE:  87/10/1934 / 88 y.o.  1 Day Post-Op Procedure(s) (LRB): RIGHT TOTAL HIP REVISION POSTERIOR APPROACH (Right)    PROGRESS NOTE Subjective: Patient is alert, oriented, no Nausea, no Vomiting, yes passing gas, . Taking PO well. Denies SOB, Chest or Calf Pain. Using Incentive Spirometer, PAS in place. Ambulate 1' Patient reports pain as  1/10  .    Objective: Vital signs in last 24 hours: Vitals:   05/29/23 1627 05/29/23 2303 05/30/23 0255 05/30/23 0644  BP: 133/63 (!) 124/55 (!) 131/47 (!) 130/52  Pulse: 72 69 73 99  Resp: 15 18 18 18   Temp: 97.7 F (36.5 C) 97.7 F (36.5 C) 97.8 F (36.6 C) 98.3 F (36.8 C)  TempSrc: Oral Oral  Oral  SpO2: 97% 96% 97% 96%  Weight:      Height:          Intake/Output from previous day: I/O last 3 completed shifts: In: 3402.5 [P.O.:480; I.V.:2522.5; IV Piggyback:400] Out: 4225 [Urine:4000; Blood:225]   Intake/Output this shift: No intake/output data recorded.   LABORATORY DATA: No results for input(s): "WBC", "HGB", "HCT", "PLT", "NA", "K", "CL", "CO2", "BUN", "CREATININE", "GLUCOSE", "GLUCAP", "INR", "CALCIUM" in the last 72 hours.  Invalid input(s): "PT", "2"  Examination: Neurologically intact ABD soft Neurovascular intact Sensation intact distally Intact pulses distally Dorsiflexion/Plantar flexion intact Incision: dressing C/D/I No cellulitis present Compartment soft} XR AP&Lat of hip shows well placed\fixed THA  Assessment:   1 Day Post-Op Procedure(s) (LRB): RIGHT TOTAL HIP REVISION POSTERIOR APPROACH (Right) ADDITIONAL DIAGNOSIS:  Expected Acute Blood Loss Anemia, Hypertension, GERD, CKD III Anticipated LOS equal to or greater than 2 midnights due to - Age 59 and older with one or more of the following:  - Obesity  - Expected need for hospital services (PT, OT, Nursing) required for safe  discharge  - Anticipated need for postoperative skilled nursing care or  inpatient rehab  - Inpt only cpt  Plan: PT/OT WBAT, THA  DVT Prophylaxis: SCDx72 hrs, ASA 81 mg BID x 2 weeks  DISCHARGE PLAN: Home, when passes PT  DISCHARGE NEEDS: HHPT, Walker, and 3-in-1 comode seatPatient ID: Alexis Stevenson, female   DOB: 03/05/1934, 89 y.o.   MRN: [id number]

## 2023-05-30 NOTE — Plan of Care (Signed)
  Problem: Education: Goal: Knowledge of the prescribed therapeutic regimen will improve Outcome: Progressing Goal: Understanding of discharge needs will improve Outcome: Progressing Goal: Individualized Educational Video(s) Outcome: Completed/Met   Problem: Activity: Goal: Ability to avoid complications of mobility impairment will improve Outcome: Progressing Goal: Ability to tolerate increased activity will improve Outcome: Progressing   Problem: Clinical Measurements: Goal: Postoperative complications will be avoided or minimized Outcome: Progressing   Problem: Pain Management: Goal: Pain level will decrease with appropriate interventions Outcome: Progressing   Problem: Skin Integrity: Goal: Will show signs of wound healing Outcome: Progressing   Problem: Education: Goal: Knowledge of General Education information will improve Description: Including pain rating scale, medication(s)/side effects and non-pharmacologic comfort measures Outcome: Progressing   Problem: Health Behavior/Discharge Planning: Goal: Ability to manage health-related needs will improve Outcome: Progressing   Problem: Clinical Measurements: Goal: Ability to maintain clinical measurements within normal limits will improve Outcome: Progressing Goal: Will remain free from infection Outcome: Progressing Goal: Diagnostic test results will improve Outcome: Progressing Goal: Respiratory complications will improve Outcome: Progressing Goal: Cardiovascular complication will be avoided Outcome: Progressing   Problem: Activity: Goal: Risk for activity intolerance will decrease Outcome: Adequate for Discharge   Problem: Nutrition: Goal: Adequate nutrition will be maintained Outcome: Completed/Met   Problem: Coping: Goal: Level of anxiety will decrease Outcome: Progressing   Problem: Elimination: Goal: Will not experience complications related to bowel motility Outcome: Progressing Goal: Will  not experience complications related to urinary retention Outcome: Progressing   Problem: Pain Managment: Goal: General experience of comfort will improve Outcome: Progressing   Problem: Safety: Goal: Ability to remain free from injury will improve Outcome: Progressing   Problem: Skin Integrity: Goal: Risk for impaired skin integrity will decrease Outcome: Progressing

## 2023-05-31 DIAGNOSIS — Z7982 Long term (current) use of aspirin: Secondary | ICD-10-CM | POA: Diagnosis not present

## 2023-05-31 DIAGNOSIS — Z9049 Acquired absence of other specified parts of digestive tract: Secondary | ICD-10-CM | POA: Diagnosis not present

## 2023-05-31 DIAGNOSIS — Z96642 Presence of left artificial hip joint: Secondary | ICD-10-CM | POA: Diagnosis not present

## 2023-05-31 DIAGNOSIS — Z8673 Personal history of transient ischemic attack (TIA), and cerebral infarction without residual deficits: Secondary | ICD-10-CM | POA: Diagnosis not present

## 2023-05-31 DIAGNOSIS — Q2733 Arteriovenous malformation of digestive system vessel: Secondary | ICD-10-CM | POA: Diagnosis not present

## 2023-05-31 DIAGNOSIS — E785 Hyperlipidemia, unspecified: Secondary | ICD-10-CM | POA: Diagnosis not present

## 2023-05-31 DIAGNOSIS — Z87891 Personal history of nicotine dependence: Secondary | ICD-10-CM | POA: Diagnosis not present

## 2023-05-31 DIAGNOSIS — M8580 Other specified disorders of bone density and structure, unspecified site: Secondary | ICD-10-CM | POA: Diagnosis not present

## 2023-05-31 DIAGNOSIS — I10 Essential (primary) hypertension: Secondary | ICD-10-CM | POA: Diagnosis not present

## 2023-05-31 DIAGNOSIS — E039 Hypothyroidism, unspecified: Secondary | ICD-10-CM | POA: Diagnosis not present

## 2023-05-31 DIAGNOSIS — T84020D Dislocation of internal right hip prosthesis, subsequent encounter: Secondary | ICD-10-CM | POA: Diagnosis not present

## 2023-05-31 DIAGNOSIS — Z96651 Presence of right artificial knee joint: Secondary | ICD-10-CM | POA: Diagnosis not present

## 2023-05-31 DIAGNOSIS — Z9181 History of falling: Secondary | ICD-10-CM | POA: Diagnosis not present

## 2023-05-31 DIAGNOSIS — D500 Iron deficiency anemia secondary to blood loss (chronic): Secondary | ICD-10-CM | POA: Diagnosis not present

## 2023-05-31 DIAGNOSIS — M1990 Unspecified osteoarthritis, unspecified site: Secondary | ICD-10-CM | POA: Diagnosis not present

## 2023-06-04 DIAGNOSIS — Z79899 Other long term (current) drug therapy: Secondary | ICD-10-CM | POA: Diagnosis not present

## 2023-06-04 DIAGNOSIS — I517 Cardiomegaly: Secondary | ICD-10-CM | POA: Diagnosis not present

## 2023-06-04 DIAGNOSIS — I10 Essential (primary) hypertension: Secondary | ICD-10-CM | POA: Diagnosis not present

## 2023-06-04 DIAGNOSIS — R2681 Unsteadiness on feet: Secondary | ICD-10-CM | POA: Diagnosis not present

## 2023-06-04 DIAGNOSIS — K219 Gastro-esophageal reflux disease without esophagitis: Secondary | ICD-10-CM | POA: Diagnosis not present

## 2023-06-04 DIAGNOSIS — N3000 Acute cystitis without hematuria: Secondary | ICD-10-CM | POA: Diagnosis not present

## 2023-06-04 DIAGNOSIS — N390 Urinary tract infection, site not specified: Secondary | ICD-10-CM | POA: Diagnosis not present

## 2023-06-04 DIAGNOSIS — R9389 Abnormal findings on diagnostic imaging of other specified body structures: Secondary | ICD-10-CM | POA: Diagnosis not present

## 2023-06-04 DIAGNOSIS — E785 Hyperlipidemia, unspecified: Secondary | ICD-10-CM | POA: Diagnosis not present

## 2023-06-04 DIAGNOSIS — E039 Hypothyroidism, unspecified: Secondary | ICD-10-CM | POA: Diagnosis not present

## 2023-06-04 DIAGNOSIS — R531 Weakness: Secondary | ICD-10-CM | POA: Diagnosis not present

## 2023-06-04 DIAGNOSIS — R9431 Abnormal electrocardiogram [ECG] [EKG]: Secondary | ICD-10-CM | POA: Diagnosis not present

## 2023-06-04 DIAGNOSIS — I700 Atherosclerosis of aorta: Secondary | ICD-10-CM | POA: Diagnosis not present

## 2023-06-04 DIAGNOSIS — M25551 Pain in right hip: Secondary | ICD-10-CM | POA: Diagnosis not present

## 2023-06-04 DIAGNOSIS — R509 Fever, unspecified: Secondary | ICD-10-CM | POA: Diagnosis not present

## 2023-06-04 DIAGNOSIS — R110 Nausea: Secondary | ICD-10-CM | POA: Diagnosis not present

## 2023-06-05 DIAGNOSIS — R531 Weakness: Secondary | ICD-10-CM | POA: Diagnosis not present

## 2023-06-05 DIAGNOSIS — N3000 Acute cystitis without hematuria: Secondary | ICD-10-CM | POA: Diagnosis not present

## 2023-06-05 DIAGNOSIS — R9389 Abnormal findings on diagnostic imaging of other specified body structures: Secondary | ICD-10-CM | POA: Diagnosis not present

## 2023-06-05 DIAGNOSIS — I517 Cardiomegaly: Secondary | ICD-10-CM | POA: Diagnosis not present

## 2023-06-05 DIAGNOSIS — R110 Nausea: Secondary | ICD-10-CM | POA: Diagnosis not present

## 2023-06-05 DIAGNOSIS — R2681 Unsteadiness on feet: Secondary | ICD-10-CM | POA: Diagnosis not present

## 2023-06-05 DIAGNOSIS — I700 Atherosclerosis of aorta: Secondary | ICD-10-CM | POA: Diagnosis not present

## 2023-06-05 DIAGNOSIS — R9431 Abnormal electrocardiogram [ECG] [EKG]: Secondary | ICD-10-CM | POA: Diagnosis not present

## 2023-06-05 DIAGNOSIS — R509 Fever, unspecified: Secondary | ICD-10-CM | POA: Diagnosis not present

## 2023-06-06 DIAGNOSIS — E039 Hypothyroidism, unspecified: Secondary | ICD-10-CM | POA: Diagnosis not present

## 2023-06-06 DIAGNOSIS — Q2733 Arteriovenous malformation of digestive system vessel: Secondary | ICD-10-CM | POA: Diagnosis not present

## 2023-06-06 DIAGNOSIS — E785 Hyperlipidemia, unspecified: Secondary | ICD-10-CM | POA: Diagnosis not present

## 2023-06-06 DIAGNOSIS — D500 Iron deficiency anemia secondary to blood loss (chronic): Secondary | ICD-10-CM | POA: Diagnosis not present

## 2023-06-06 DIAGNOSIS — I10 Essential (primary) hypertension: Secondary | ICD-10-CM | POA: Diagnosis not present

## 2023-06-06 DIAGNOSIS — T84020D Dislocation of internal right hip prosthesis, subsequent encounter: Secondary | ICD-10-CM | POA: Diagnosis not present

## 2023-06-07 DIAGNOSIS — T84020A Dislocation of internal right hip prosthesis, initial encounter: Secondary | ICD-10-CM | POA: Diagnosis not present

## 2023-06-08 DIAGNOSIS — E785 Hyperlipidemia, unspecified: Secondary | ICD-10-CM | POA: Diagnosis not present

## 2023-06-08 DIAGNOSIS — Q2733 Arteriovenous malformation of digestive system vessel: Secondary | ICD-10-CM | POA: Diagnosis not present

## 2023-06-08 DIAGNOSIS — T84020D Dislocation of internal right hip prosthesis, subsequent encounter: Secondary | ICD-10-CM | POA: Diagnosis not present

## 2023-06-08 DIAGNOSIS — E039 Hypothyroidism, unspecified: Secondary | ICD-10-CM | POA: Diagnosis not present

## 2023-06-08 DIAGNOSIS — I10 Essential (primary) hypertension: Secondary | ICD-10-CM | POA: Diagnosis not present

## 2023-06-08 DIAGNOSIS — D500 Iron deficiency anemia secondary to blood loss (chronic): Secondary | ICD-10-CM | POA: Diagnosis not present

## 2023-06-10 DIAGNOSIS — E785 Hyperlipidemia, unspecified: Secondary | ICD-10-CM | POA: Diagnosis not present

## 2023-06-10 DIAGNOSIS — I10 Essential (primary) hypertension: Secondary | ICD-10-CM | POA: Diagnosis not present

## 2023-06-10 DIAGNOSIS — E039 Hypothyroidism, unspecified: Secondary | ICD-10-CM | POA: Diagnosis not present

## 2023-06-10 DIAGNOSIS — T84020D Dislocation of internal right hip prosthesis, subsequent encounter: Secondary | ICD-10-CM | POA: Diagnosis not present

## 2023-06-10 DIAGNOSIS — D500 Iron deficiency anemia secondary to blood loss (chronic): Secondary | ICD-10-CM | POA: Diagnosis not present

## 2023-06-10 DIAGNOSIS — Q2733 Arteriovenous malformation of digestive system vessel: Secondary | ICD-10-CM | POA: Diagnosis not present

## 2023-06-13 DIAGNOSIS — I10 Essential (primary) hypertension: Secondary | ICD-10-CM | POA: Diagnosis not present

## 2023-06-13 DIAGNOSIS — E785 Hyperlipidemia, unspecified: Secondary | ICD-10-CM | POA: Diagnosis not present

## 2023-06-13 DIAGNOSIS — E039 Hypothyroidism, unspecified: Secondary | ICD-10-CM | POA: Diagnosis not present

## 2023-06-13 DIAGNOSIS — T84020D Dislocation of internal right hip prosthesis, subsequent encounter: Secondary | ICD-10-CM | POA: Diagnosis not present

## 2023-06-13 DIAGNOSIS — D500 Iron deficiency anemia secondary to blood loss (chronic): Secondary | ICD-10-CM | POA: Diagnosis not present

## 2023-06-13 DIAGNOSIS — Q2733 Arteriovenous malformation of digestive system vessel: Secondary | ICD-10-CM | POA: Diagnosis not present

## 2023-06-16 DIAGNOSIS — I10 Essential (primary) hypertension: Secondary | ICD-10-CM | POA: Diagnosis not present

## 2023-06-16 DIAGNOSIS — Q2733 Arteriovenous malformation of digestive system vessel: Secondary | ICD-10-CM | POA: Diagnosis not present

## 2023-06-16 DIAGNOSIS — E785 Hyperlipidemia, unspecified: Secondary | ICD-10-CM | POA: Diagnosis not present

## 2023-06-16 DIAGNOSIS — E039 Hypothyroidism, unspecified: Secondary | ICD-10-CM | POA: Diagnosis not present

## 2023-06-16 DIAGNOSIS — D500 Iron deficiency anemia secondary to blood loss (chronic): Secondary | ICD-10-CM | POA: Diagnosis not present

## 2023-06-16 DIAGNOSIS — T84020D Dislocation of internal right hip prosthesis, subsequent encounter: Secondary | ICD-10-CM | POA: Diagnosis not present

## 2023-06-20 DIAGNOSIS — D500 Iron deficiency anemia secondary to blood loss (chronic): Secondary | ICD-10-CM | POA: Diagnosis not present

## 2023-06-20 DIAGNOSIS — E785 Hyperlipidemia, unspecified: Secondary | ICD-10-CM | POA: Diagnosis not present

## 2023-06-20 DIAGNOSIS — T84020D Dislocation of internal right hip prosthesis, subsequent encounter: Secondary | ICD-10-CM | POA: Diagnosis not present

## 2023-06-20 DIAGNOSIS — Q2733 Arteriovenous malformation of digestive system vessel: Secondary | ICD-10-CM | POA: Diagnosis not present

## 2023-06-20 DIAGNOSIS — E039 Hypothyroidism, unspecified: Secondary | ICD-10-CM | POA: Diagnosis not present

## 2023-06-20 DIAGNOSIS — I10 Essential (primary) hypertension: Secondary | ICD-10-CM | POA: Diagnosis not present

## 2023-07-06 DIAGNOSIS — T84020A Dislocation of internal right hip prosthesis, initial encounter: Secondary | ICD-10-CM | POA: Diagnosis not present

## 2023-09-05 DIAGNOSIS — E039 Hypothyroidism, unspecified: Secondary | ICD-10-CM | POA: Diagnosis not present

## 2023-09-05 DIAGNOSIS — E782 Mixed hyperlipidemia: Secondary | ICD-10-CM | POA: Diagnosis not present

## 2023-09-05 DIAGNOSIS — M8580 Other specified disorders of bone density and structure, unspecified site: Secondary | ICD-10-CM | POA: Diagnosis not present

## 2023-09-05 DIAGNOSIS — I10 Essential (primary) hypertension: Secondary | ICD-10-CM | POA: Diagnosis not present

## 2023-09-05 DIAGNOSIS — Z23 Encounter for immunization: Secondary | ICD-10-CM | POA: Diagnosis not present

## 2023-09-05 DIAGNOSIS — E559 Vitamin D deficiency, unspecified: Secondary | ICD-10-CM | POA: Diagnosis not present

## 2023-09-21 ENCOUNTER — Other Ambulatory Visit (HOSPITAL_COMMUNITY): Payer: Self-pay

## 2023-10-09 DIAGNOSIS — Z6834 Body mass index (BMI) 34.0-34.9, adult: Secondary | ICD-10-CM | POA: Diagnosis not present

## 2023-10-09 DIAGNOSIS — R600 Localized edema: Secondary | ICD-10-CM | POA: Diagnosis not present

## 2024-07-30 ENCOUNTER — Encounter (HOSPITAL_BASED_OUTPATIENT_CLINIC_OR_DEPARTMENT_OTHER): Payer: Self-pay | Admitting: Family Medicine

## 2024-07-30 ENCOUNTER — Ambulatory Visit (HOSPITAL_BASED_OUTPATIENT_CLINIC_OR_DEPARTMENT_OTHER): Admitting: Family Medicine

## 2024-07-30 VITALS — BP 136/55 | HR 82 | Temp 98.1°F | Resp 16 | Ht 64.96 in | Wt 184.1 lb

## 2024-07-30 DIAGNOSIS — D510 Vitamin B12 deficiency anemia due to intrinsic factor deficiency: Secondary | ICD-10-CM | POA: Diagnosis not present

## 2024-07-30 DIAGNOSIS — R413 Other amnesia: Secondary | ICD-10-CM | POA: Insufficient documentation

## 2024-07-30 DIAGNOSIS — E039 Hypothyroidism, unspecified: Secondary | ICD-10-CM

## 2024-07-30 DIAGNOSIS — K219 Gastro-esophageal reflux disease without esophagitis: Secondary | ICD-10-CM | POA: Diagnosis not present

## 2024-07-30 DIAGNOSIS — I10 Essential (primary) hypertension: Secondary | ICD-10-CM | POA: Diagnosis not present

## 2024-07-30 MED ORDER — CYANOCOBALAMIN 1000 MCG/ML IJ SOLN
1000.0000 ug | Freq: Once | INTRAMUSCULAR | Status: AC
Start: 2024-07-30 — End: 2024-07-30
  Administered 2024-07-30: 1000 ug via INTRAMUSCULAR

## 2024-07-30 NOTE — Progress Notes (Unsigned)
 Patient is in office today for a nurse visit for B12 Injection. Patient Injection was given in the  Right deltoid. Patient tolerated injection well.

## 2024-07-30 NOTE — Progress Notes (Unsigned)
 Established Patient Office Visit  Subjective   Patient ID: Alexis Stevenson, female    DOB: 1934-08-05  Age: 89 y.o. MRN: [id number]  Chief Complaint  Patient presents with  . Establish Care    Establish Care     F/u as above.  Formerly seen by me at [facility] about 1.5 years ago.  Great to see her again.  She is now living in assisted living.  Increasing difficulties with her memory.    Past Medical History:  Diagnosis Date  . AVM (arteriovenous malformation)    small intestines  . Essential hypertension   . Former smoker   . Hyperlipidemia   . Hypothyroidism   . Osteopenia   . Stroke [facility])    H/O    Outpatient Encounter Medications as of 07/30/2024  Medication Sig  . Cholecalciferol 50 MCG (2000 UT) TABS Take 2,000 Units by mouth daily.  . cyanocobalamin  1000 MCG tablet Take 1,000 mcg by mouth daily.  . diphenhydramine -acetaminophen  (TYLENOL  PM) 25-500 MG TABS tablet Take 1 tablet by mouth at bedtime. Nightly as needed  . ferrous sulfate  325 (65 FE) MG tablet Take 325 mg by mouth daily.  . levothyroxine  (SYNTHROID ) 100 MCG tablet Take 100 mcg by mouth daily before breakfast.  . Multiple Vitamins-Minerals (CENTRUM SILVER 50+WOMEN) TABS Take 1 tablet by mouth daily.  SABRA olmesartan (BENICAR) 20 MG tablet Take 20 mg by mouth daily.  SABRA senna (SENOKOT) 8.6 MG tablet Take 1 tablet by mouth as needed for constipation.  . Vitamin D, Ergocalciferol, (DRISDOL) 1.25 MG (50000 UNIT) CAPS capsule Take 50,000 Units by mouth 2 (two) times a week.  . aspirin  EC 81 MG tablet Take 1 tablet (81 mg total) by mouth 2 (two) times daily. (Patient not taking: Reported on 07/30/2024)  . omeprazole (PRILOSEC) 20 MG capsule Take 20 mg by mouth daily. (Patient not taking: Reported on 07/30/2024)  . oxyCODONE -acetaminophen  (PERCOCET/ROXICET) 5-325 MG tablet Take 1 tablet by mouth every 4 (four) hours as needed for severe pain. (Patient not taking: Reported on 07/30/2024)  . rosuvastatin (CRESTOR) 20 MG  tablet Take 20 mg by mouth daily. (Patient not taking: Reported on 07/30/2024)  . tiZANidine  (ZANAFLEX ) 2 MG tablet Take 1 tablet (2 mg total) by mouth every 6 (six) hours as needed for muscle spasms. (Patient not taking: Reported on 07/30/2024)   Facility-Administered Encounter Medications as of 07/30/2024  Medication  . cyanocobalamin  (VITAMIN B12) injection 1,000 mcg    Social History   Tobacco Use  . Smoking status: Former    Current packs/day: 0.00    Types: Cigarettes    Quit date: 11/09/2005    Years since quitting: 18.7  . Smokeless tobacco: Never  . Tobacco comments:    1-2 ppd  Vaping Use  . Vaping status: Never Used  Substance Use Topics  . Alcohol use: Never  . Drug use: Never    {History (Optional):23778}  Review of Systems  Constitutional:  Negative for diaphoresis, fever, malaise/fatigue and weight loss.  Respiratory:  Negative for cough, shortness of breath and wheezing.   Cardiovascular:  Negative for chest pain, palpitations, orthopnea, claudication, leg swelling and PND.      Objective:     BP (!) 136/55 (BP Location: Right Arm, Patient Position: Standing, Cuff Size: Normal)   Pulse 82   Temp 98.1 F (36.7 C) (Oral)   Resp 16   Ht 5' 4.96 (1.65 m)   Wt 184 lb 1.6 oz (83.5 kg)   SpO2  93%   BMI 30.67 kg/m  {Vitals History (Optional):23777}  Physical Exam Constitutional:      General: She is not in acute distress.    Appearance: Normal appearance.     Comments: Delightful.  Gait a little stiff with cane used.  HENT:     Head: Normocephalic.  Neck:     Vascular: No carotid bruit.  Cardiovascular:     Rate and Rhythm: Normal rate and regular rhythm.     Pulses: Normal pulses.     Heart sounds: Normal heart sounds.  Pulmonary:     Effort: Pulmonary effort is normal.     Breath sounds: Normal breath sounds.  Abdominal:     General: Bowel sounds are normal.     Palpations: Abdomen is soft.  Musculoskeletal:     Cervical back: Neck supple.  No tenderness.     Right lower leg: No edema.     Left lower leg: No edema.  Neurological:     Mental Status: She is alert.      No results found for any visits on 07/30/24.  {Labs (Optional):23779}  The ASCVD Risk score (Arnett DK, et al., 2019) failed to calculate for the following reasons:   The 2019 ASCVD risk score is only valid for ages 80 to 71   Risk score cannot be calculated because patient has a medical history suggesting prior/existing ASCVD    Assessment & Plan:  Acquired hypothyroidism  Primary hypertension  Gastroesophageal reflux disease, unspecified whether esophagitis present  Memory loss  Pernicious anemia -     Cyanocobalamin     Return in about 4 weeks (around 08/27/2024) for chronic follow-up.    REDDING PONCE NORLEEN FALCON., MD

## 2024-07-31 ENCOUNTER — Encounter (HOSPITAL_BASED_OUTPATIENT_CLINIC_OR_DEPARTMENT_OTHER): Payer: Self-pay | Admitting: Family Medicine

## 2024-07-31 NOTE — Assessment & Plan Note (Signed)
 Controlled, continue current rx.

## 2024-07-31 NOTE — Assessment & Plan Note (Signed)
 Clinically stable.  Promptly request and review old records.

## 2024-07-31 NOTE — Assessment & Plan Note (Signed)
 Obviously f/u on this concern once old records available.  Labs deferred today as she had them not too long ago.

## 2024-08-27 ENCOUNTER — Encounter (HOSPITAL_BASED_OUTPATIENT_CLINIC_OR_DEPARTMENT_OTHER): Payer: Self-pay | Admitting: Family Medicine

## 2024-08-27 ENCOUNTER — Ambulatory Visit (INDEPENDENT_AMBULATORY_CARE_PROVIDER_SITE_OTHER): Admitting: Family Medicine

## 2024-08-27 ENCOUNTER — Ambulatory Visit (HOSPITAL_BASED_OUTPATIENT_CLINIC_OR_DEPARTMENT_OTHER): Admitting: Family Medicine

## 2024-08-27 VITALS — BP 121/73 | HR 77 | Temp 98.3°F | Resp 16 | Wt 185.9 lb

## 2024-08-27 DIAGNOSIS — I10 Essential (primary) hypertension: Secondary | ICD-10-CM | POA: Diagnosis not present

## 2024-08-27 DIAGNOSIS — F5101 Primary insomnia: Secondary | ICD-10-CM

## 2024-08-27 DIAGNOSIS — E785 Hyperlipidemia, unspecified: Secondary | ICD-10-CM

## 2024-08-27 DIAGNOSIS — E039 Hypothyroidism, unspecified: Secondary | ICD-10-CM | POA: Diagnosis not present

## 2024-08-27 DIAGNOSIS — D510 Vitamin B12 deficiency anemia due to intrinsic factor deficiency: Secondary | ICD-10-CM

## 2024-08-27 DIAGNOSIS — M85832 Other specified disorders of bone density and structure, left forearm: Secondary | ICD-10-CM

## 2024-08-27 DIAGNOSIS — D500 Iron deficiency anemia secondary to blood loss (chronic): Secondary | ICD-10-CM

## 2024-08-27 DIAGNOSIS — G4700 Insomnia, unspecified: Secondary | ICD-10-CM | POA: Insufficient documentation

## 2024-08-27 DIAGNOSIS — M8580 Other specified disorders of bone density and structure, unspecified site: Secondary | ICD-10-CM

## 2024-08-27 NOTE — Progress Notes (Signed)
 Established Patient Office Visit  Subjective   Patient ID: Alexis Stevenson, female    DOB: [birth year]  Age: 89 y.o. MRN: [id number]  Chief Complaint  Patient presents with   Follow-up    Follow-up     F/u as above.  Please see last note for details.  Old records have been received and are appreciated.  Overall she is doing quite well with few concerns.  Of note, she saw a Neurologist in [place]  some months ago who reportedly saw no evidence of a previous stroke.  Extended discussion today.    Past Medical History:  Diagnosis Date   Anemia    Iron deficiency known to Drs. Misenheimer and Lewis   AVM (arteriovenous malformation)    small intestines   DNR (do not resuscitate)    Dyslipidemia    Essential hypertension    Former smoker    Gait disorder    Multifactorial   Hypothyroidism    Obesity    Osteopenia    Pernicious anemia     Outpatient Encounter Medications as of 08/27/2024  Medication Sig   Cholecalciferol 50 MCG (2000 UT) TABS Take 2,000 Units by mouth daily.   cyanocobalamin  1000 MCG tablet Take 1,000 mcg by mouth daily.   diphenhydramine -acetaminophen  (TYLENOL  PM) 25-500 MG TABS tablet Take 1 tablet by mouth at bedtime. Nightly as needed   levothyroxine  (SYNTHROID ) 100 MCG tablet Take 100 mcg by mouth daily before breakfast.   Multiple Vitamins-Minerals (CENTRUM SILVER 50+WOMEN) TABS Take 1 tablet by mouth daily.   olmesartan (BENICAR) 20 MG tablet Take 20 mg by mouth daily.   olmesartan-hydrochlorothiazide (BENICAR HCT) 20-12.5 MG tablet Take 1 tablet by mouth daily.   senna (SENOKOT) 8.6 MG tablet Take 1 tablet by mouth as needed for constipation.   aspirin  EC 81 MG tablet Take 1 tablet (81 mg total) by mouth 2 (two) times daily. (Patient not taking: Reported on 07/30/2024)   ferrous sulfate  325 (65 FE) MG tablet Take 325 mg by mouth daily. (Patient not taking: Reported on 08/27/2024)   omeprazole (PRILOSEC) 20 MG capsule Take 20 mg by mouth daily.  (Patient not taking: Reported on 07/30/2024)   oxyCODONE -acetaminophen  (PERCOCET/ROXICET) 5-325 MG tablet Take 1 tablet by mouth every 4 (four) hours as needed for severe pain. (Patient not taking: Reported on 07/30/2024)   rosuvastatin (CRESTOR) 20 MG tablet Take 20 mg by mouth daily. (Patient not taking: Reported on 07/30/2024)   tiZANidine  (ZANAFLEX ) 2 MG tablet Take 1 tablet (2 mg total) by mouth every 6 (six) hours as needed for muscle spasms. (Patient not taking: Reported on 07/30/2024)   Vitamin D, Ergocalciferol, (DRISDOL) 1.25 MG (50000 UNIT) CAPS capsule Take 50,000 Units by mouth 2 (two) times a week. (Patient not taking: Reported on 08/27/2024)   No facility-administered encounter medications on file as of 08/27/2024.    Social History   Tobacco Use   Smoking status: Former    Current packs/day: 0.00    Types: Cigarettes    Quit date: 11/09/2005    Years since quitting: 18.8   Smokeless tobacco: Never   Tobacco comments:    1-2 ppd  Vaping Use   Vaping status: Never Used  Substance Use Topics   Alcohol use: Never   Drug use: Never      Review of Systems  Constitutional:  Negative for diaphoresis, fever, malaise/fatigue and weight loss.  Respiratory:  Negative for cough, shortness of breath and wheezing.   Cardiovascular:  Negative for chest pain,  palpitations, orthopnea, claudication, leg swelling and PND.      Objective:     BP 121/73 (Cuff Size: Normal)   Pulse 77   Temp 98.3 F (36.8 C) (Oral)   Resp 16   Wt 185 lb 14.4 oz (84.3 kg)   SpO2 94%   BMI 30.97 kg/m    Physical Exam Constitutional:      General: She is not in acute distress.    Appearance: Normal appearance.     Comments: Delightful.  Stiff gait with cane used.  HENT:     Head: Normocephalic.  Neck:     Vascular: No carotid bruit.  Cardiovascular:     Rate and Rhythm: Normal rate and regular rhythm.     Pulses: Normal pulses.     Heart sounds: Normal heart sounds.  Pulmonary:      Effort: Pulmonary effort is normal.     Breath sounds: Normal breath sounds.  Abdominal:     General: Bowel sounds are normal.     Palpations: Abdomen is soft.  Musculoskeletal:     Cervical back: Neck supple. No tenderness.     Right lower leg: No edema.     Left lower leg: No edema.  Neurological:     Mental Status: She is alert.      No results found for any visits on 08/27/24.    The ASCVD Risk score (Arnett DK, et al., 2019) failed to calculate for the following reasons:   The 2019 ASCVD risk score is only valid for ages 24 to 30    Assessment & Plan:  Primary hypertension Assessment & Plan: Controlled.  Orders: -     Comprehensive metabolic panel with GFR  Acquired hypothyroidism Assessment & Plan: Await labs and f/u with her soon.  Orders: -     TSH  Iron deficiency anemia due to chronic blood loss -     CBC with Differential/Platelet  Dyslipidemia -     Lipid panel  Pernicious anemia -     Vitamin B12  Osteopenia, unspecified location -     DG Bone Density; Future  Other specified disorders of bone density and structure, left forearm -     DG Bone Density; Future  Primary insomnia Assessment & Plan: Sleep hygiene.  I discouraged her Tylenol  PM.  Alert me if an alternative is needed.     Return in about 6 months (around 02/25/2025) for chronic follow-up.    REDDING PONCE NORLEEN FALCON., MD

## 2024-08-27 NOTE — Assessment & Plan Note (Signed)
 Await labs and f/u with her soon.

## 2024-08-27 NOTE — Assessment & Plan Note (Signed)
 Controlled.

## 2024-08-27 NOTE — Assessment & Plan Note (Signed)
 Sleep hygiene.  I discouraged her Tylenol  PM.  Alert me if an alternative is needed.

## 2024-08-28 ENCOUNTER — Telehealth (HOSPITAL_BASED_OUTPATIENT_CLINIC_OR_DEPARTMENT_OTHER): Payer: Self-pay | Admitting: *Deleted

## 2024-08-28 LAB — COMPREHENSIVE METABOLIC PANEL WITH GFR
ALT: 23 IU/L (ref 0–32)
AST: 32 IU/L (ref 0–40)
Albumin: 4.4 g/dL (ref 3.7–4.7)
Alkaline Phosphatase: 58 IU/L (ref 48–129)
BUN/Creatinine Ratio: 21 (ref 12–28)
BUN: 22 mg/dL (ref 8–27)
Bilirubin Total: 0.3 mg/dL (ref 0.0–1.2)
CO2: 24 mmol/L (ref 20–29)
Calcium: 9.4 mg/dL (ref 8.7–10.3)
Chloride: 103 mmol/L (ref 96–106)
Creatinine, Ser: 1.04 mg/dL — ABNORMAL HIGH (ref 0.57–1.00)
Globulin, Total: 1.9 g/dL (ref 1.5–4.5)
Glucose: 98 mg/dL (ref 70–99)
Potassium: 4 mmol/L (ref 3.5–5.2)
Sodium: 141 mmol/L (ref 134–144)
Total Protein: 6.3 g/dL (ref 6.0–8.5)
eGFR: 52 mL/min/1.73 — ABNORMAL LOW (ref >59)

## 2024-08-28 LAB — CBC WITH DIFFERENTIAL/PLATELET
Basophils Absolute: 0.1 x10E3/uL (ref 0.0–0.2)
Basos: 1 %
EOS (ABSOLUTE): 0.2 x10E3/uL (ref 0.0–0.4)
Eos: 2 %
Hematocrit: 41.1 % (ref 34.0–46.6)
Hemoglobin: 13.3 g/dL (ref 11.1–15.9)
Immature Grans (Abs): 0 x10E3/uL (ref 0.0–0.1)
Immature Granulocytes: 0 %
Lymphocytes Absolute: 1.7 x10E3/uL (ref 0.7–3.1)
Lymphs: 21 %
MCH: 32.6 pg (ref 26.6–33.0)
MCHC: 32.4 g/dL (ref 31.5–35.7)
MCV: 101 fL — ABNORMAL HIGH (ref 79–97)
Monocytes Absolute: 0.6 x10E3/uL (ref 0.1–0.9)
Monocytes: 8 %
Neutrophils Absolute: 5.6 x10E3/uL (ref 1.4–7.0)
Neutrophils: 68 %
Platelets: 269 x10E3/uL (ref 150–450)
RBC: 4.08 x10E6/uL (ref 3.77–5.28)
RDW: 12.4 % (ref 11.7–15.4)
WBC: 8.2 x10E3/uL (ref 3.4–10.8)

## 2024-08-28 LAB — LIPID PANEL
Chol/HDL Ratio: 2.4 ratio (ref 0.0–4.4)
Cholesterol, Total: 123 mg/dL (ref 100–199)
HDL: 51 mg/dL (ref >39)
LDL Chol Calc (NIH): 46 mg/dL (ref 0–99)
Triglycerides: 152 mg/dL — ABNORMAL HIGH (ref 0–149)
VLDL Cholesterol Cal: 26 mg/dL (ref 5–40)

## 2024-08-28 LAB — TSH: TSH: 5.47 u[IU]/mL — ABNORMAL HIGH (ref 0.450–4.500)

## 2024-08-28 NOTE — Telephone Encounter (Signed)
 Copied from CRM [id number]. Topic: General - Other >> Aug 27, 2024  2:15 PM Fonda T wrote: Reason for CRM: Received call from [employer], with [employer] states she is returning call to office regarding patient.  Also reports that patient is already signed up to see PCP provider through assisted living. States patient cannot see two different PCP providers.  Can be reached back at [phone], to discuss further.

## 2024-08-29 ENCOUNTER — Ambulatory Visit (HOSPITAL_BASED_OUTPATIENT_CLINIC_OR_DEPARTMENT_OTHER): Payer: Self-pay | Admitting: Family Medicine

## 2024-09-20 ENCOUNTER — Ambulatory Visit: Payer: Self-pay

## 2024-09-20 NOTE — Telephone Encounter (Signed)
 FYI Only or Action Required?: FYI only for provider: appointment scheduled on 09/25/2024.  Patient was last seen in primary care on 08/27/2024 by Dottie Norleen PHEBE PONCE, MD.  Called Nurse Triage reporting Joint Swelling.  Symptoms began several days ago.  Interventions attempted: Nothing.  Symptoms are: gradually worsening.  Triage Disposition: See PCP When Office is Open (Within 3 Days)  Patient/caregiver understands and will follow disposition?: Yes         Copied from CRM #[id number]. Topic: Clinical - Red Word Triage >> Sep 20, 2024  2:20 PM [name] wrote: Patient started experiencing left knee swelling about 2 nights ago and it's progressively getting worse. She can't put any weight on it. Reason for Disposition  MILD or MODERATE swelling (e.g., can't move joint normally, can't do usual activities) (Exceptions: Itchy, localized swelling; swelling is chronic.)  Answer Assessment - Initial Assessment Questions 1. LOCATION: Where is the swelling located?  (e.g., left, right, both knees)     L knee  2. ONSET: When did the swelling start? Does it come and go, or is it there all the time?     2 nights ago but states symptoms are ongoing.  3. SWELLING: How bad is the swelling? Or, How large is it? (e.g., mild, moderate, severe; size of localized swelling)      Mild to moderate  4. PAIN: Is there any pain? If Yes, ask: How bad is it? (Scale 0-10; or none, mild, moderate, severe)     Severe  5. AGGRAVATING FACTORS: What makes the knee swelling worse? (e.g., walking, climbing stairs, running)     Walking makes symptoms worse  7. ASSOCIATED SYMPTOMS: Is there any pain or redness?     Yes, pain and some mild redness and warm to touch.  8. OTHER SYMPTOMS: Do you have any other symptoms? (e.g., calf pain, chest pain, difficulty breathing, fever)     Denies    Having to use 2 canes due to symptoms. She states symptoms are chronic. Advised ED for worsening  symptoms.  Protocols used: Knee Swelling-A-AH

## 2024-09-23 NOTE — Telephone Encounter (Signed)
 Tried to contact pt to see how she was doing and give her the below message.  The phone only rang and no option to LVM.  Will route to team for f/u.

## 2024-09-24 ENCOUNTER — Encounter (HOSPITAL_BASED_OUTPATIENT_CLINIC_OR_DEPARTMENT_OTHER): Payer: Self-pay

## 2024-09-25 ENCOUNTER — Ambulatory Visit (INDEPENDENT_AMBULATORY_CARE_PROVIDER_SITE_OTHER): Admitting: Family Medicine

## 2024-09-25 ENCOUNTER — Encounter (HOSPITAL_BASED_OUTPATIENT_CLINIC_OR_DEPARTMENT_OTHER): Payer: Self-pay | Admitting: Family Medicine

## 2024-09-25 VITALS — BP 127/69 | HR 79 | Temp 97.7°F | Resp 16 | Wt 185.9 lb

## 2024-09-25 DIAGNOSIS — M25562 Pain in left knee: Secondary | ICD-10-CM | POA: Insufficient documentation

## 2024-09-25 DIAGNOSIS — G8929 Other chronic pain: Secondary | ICD-10-CM

## 2024-09-25 NOTE — Progress Notes (Signed)
 Established Patient Office Visit  Subjective   Patient ID: Alexis Stevenson, female    DOB: 12-16-1933  Age: 89 y.o. MRN: [id number]  Chief Complaint  Patient presents with   Joint Swelling    Knee swelling    She is frustrated with days of worsening Left knee pain.  No h/o trauma or obvious overexertion.  No other acute joint concerns.  She hasn't had this knee injected in months, if ever, and would like that today.    Past Medical History:  Diagnosis Date   Anemia    Iron deficiency known to Drs. Misenheimer and Lewis   AVM (arteriovenous malformation)    small intestines   DNR (do not resuscitate)    Dyslipidemia    Essential hypertension    Former smoker    Gait disorder    Multifactorial   Hypothyroidism    Obesity    Osteopenia    Pernicious anemia     Outpatient Encounter Medications as of 09/25/2024  Medication Sig   Cholecalciferol 50 MCG (2000 UT) TABS Take 2,000 Units by mouth daily.   cyanocobalamin  1000 MCG tablet Take 1,000 mcg by mouth daily.   diphenhydramine -acetaminophen  (TYLENOL  PM) 25-500 MG TABS tablet Take 1 tablet by mouth at bedtime. Nightly as needed   levothyroxine  (SYNTHROID ) 100 MCG tablet Take 100 mcg by mouth daily before breakfast.   Multiple Vitamins-Minerals (CENTRUM SILVER 50+WOMEN) TABS Take 1 tablet by mouth daily.   olmesartan (BENICAR) 20 MG tablet Take 20 mg by mouth daily.   olmesartan-hydrochlorothiazide (BENICAR HCT) 20-12.5 MG tablet Take 1 tablet by mouth daily.   senna (SENOKOT) 8.6 MG tablet Take 1 tablet by mouth as needed for constipation.   aspirin  EC 81 MG tablet Take 1 tablet (81 mg total) by mouth 2 (two) times daily. (Patient not taking: Reported on 07/30/2024)   ferrous sulfate  325 (65 FE) MG tablet Take 325 mg by mouth daily. (Patient not taking: Reported on 08/27/2024)   omeprazole (PRILOSEC) 20 MG capsule Take 20 mg by mouth daily. (Patient not taking: Reported on 07/30/2024)   oxyCODONE -acetaminophen   (PERCOCET/ROXICET) 5-325 MG tablet Take 1 tablet by mouth every 4 (four) hours as needed for severe pain. (Patient not taking: Reported on 07/30/2024)   rosuvastatin (CRESTOR) 20 MG tablet Take 20 mg by mouth daily. (Patient not taking: Reported on 07/30/2024)   tiZANidine  (ZANAFLEX ) 2 MG tablet Take 1 tablet (2 mg total) by mouth every 6 (six) hours as needed for muscle spasms. (Patient not taking: Reported on 07/30/2024)   Vitamin D, Ergocalciferol, (DRISDOL) 1.25 MG (50000 UNIT) CAPS capsule Take 50,000 Units by mouth 2 (two) times a week. (Patient not taking: Reported on 08/27/2024)   No facility-administered encounter medications on file as of 09/25/2024.    Social History   Tobacco Use   Smoking status: Former    Current packs/day: 0.00    Types: Cigarettes    Quit date: 11/09/2005    Years since quitting: 18.8   Smokeless tobacco: Never   Tobacco comments:    1-2 ppd  Vaping Use   Vaping status: Never Used  Substance Use Topics   Alcohol use: Never   Drug use: Never      ROS    Objective:     BP 127/69 (BP Location: Right Arm, Patient Position: Standing, Cuff Size: Large)   Pulse 79   Temp 97.7 F (36.5 C) (Oral)   Resp 16   Wt 185 lb 14.4 oz (84.3 kg)  SpO2 94%   BMI 30.97 kg/m    Physical Exam Constitutional:      General: She is not in acute distress.    Appearance: Normal appearance.  HENT:     Head: Normocephalic.  Neck:     Vascular: No carotid bruit.  Cardiovascular:     Rate and Rhythm: Normal rate and regular rhythm.     Pulses: Normal pulses.     Heart sounds: Normal heart sounds.  Pulmonary:     Effort: Pulmonary effort is normal.     Breath sounds: Normal breath sounds.  Abdominal:     General: Bowel sounds are normal.     Palpations: Abdomen is soft.  Musculoskeletal:     Cervical back: Neck supple. No tenderness.     Right lower leg: No edema.     Left lower leg: No edema.     Comments: Left knee with at least moderate OA changes.   No evidence of ligamentous or meniscal insult.  No effusion.  Neurological:     Mental Status: She is alert.      No results found for any visits on 09/25/24.    The ASCVD Risk score (Arnett DK, et al., 2019) failed to calculate for the following reasons:   The 2019 ASCVD risk score is only valid for ages 65 to 13    Assessment & Plan:  Chronic pain of left knee  Joint Injection/Arthrocentesis  Date/Time: 09/25/2024 6:24 PM  Performed by: Dottie Norleen PHEBE PONCE, MD Authorized by: Dottie Norleen PHEBE PONCE, MD  Indications: pain  Body area: knee Local anesthesia used: yes  Anesthesia: Local anesthesia used: yes Local Anesthetic: lidocaine  1% without epinephrine   Sedation: Patient sedated: no  Preparation: Patient was prepped and draped in the usual sterile fashion. Needle size: 20 G Ultrasound guidance: no Approach: anterior Aspirate amount: 0 mL Triamcinolone  amount: 80 mg Patient tolerance: patient tolerated the procedure well with no immediate complications Comments: Alert us  if knee isn't better in a few days.      No follow-ups on file.    REDDING PONCE NORLEEN FALCON., MD

## 2024-10-03 ENCOUNTER — Encounter (HOSPITAL_BASED_OUTPATIENT_CLINIC_OR_DEPARTMENT_OTHER): Payer: Self-pay | Admitting: Student

## 2024-10-03 ENCOUNTER — Ambulatory Visit (INDEPENDENT_AMBULATORY_CARE_PROVIDER_SITE_OTHER): Admitting: Student

## 2024-10-03 VITALS — BP 125/72 | HR 69 | Temp 98.1°F | Resp 16 | Ht 64.96 in | Wt 181.2 lb

## 2024-10-03 DIAGNOSIS — R079 Chest pain, unspecified: Secondary | ICD-10-CM

## 2024-10-03 DIAGNOSIS — K219 Gastro-esophageal reflux disease without esophagitis: Secondary | ICD-10-CM

## 2024-10-03 DIAGNOSIS — E039 Hypothyroidism, unspecified: Secondary | ICD-10-CM | POA: Diagnosis not present

## 2024-10-03 DIAGNOSIS — R062 Wheezing: Secondary | ICD-10-CM

## 2024-10-03 MED ORDER — PANTOPRAZOLE SODIUM 40 MG PO TBEC
40.0000 mg | DELAYED_RELEASE_TABLET | Freq: Every day | ORAL | 3 refills | Status: AC
Start: 2024-10-03 — End: ?

## 2024-10-03 MED ORDER — ALBUTEROL SULFATE HFA 108 (90 BASE) MCG/ACT IN AERS
2.0000 | INHALATION_SPRAY | Freq: Four times a day (QID) | RESPIRATORY_TRACT | 0 refills | Status: AC | PRN
Start: 2024-10-03 — End: ?

## 2024-10-03 NOTE — Patient Instructions (Addendum)
 It was nice to see you today!  Any chest pain that does not go away with rest and is associated with shortness of breath, nausea, or radiation to the arm or jaw should go to the ER immediately.  If you have any problems before your next visit feel free to message me via MyChart (minor issues or questions) or call the office, otherwise you may reach out to schedule an office visit.  Thank you! Emileigh Kellett, PA-C

## 2024-10-03 NOTE — Progress Notes (Signed)
 Established Patient Office Visit  Subjective   Patient ID: Alexis Stevenson, female    DOB: Apr 02, 1934  Age: 89 y.o. MRN: [id number]  Chief Complaint  Patient presents with   Medical Management of Chronic Issues    Was at [facility] ED on 09/30/2024. Pt feels SOB.    HPI  Discussed the use of AI scribe software for clinical note transcription with the patient, who gave verbal consent to proceed.  History of Present Illness   Alexis Stevenson is an 89 year old female who presents for ER follow up after visiting [facility] Ed on 09/30/24.  She experienced chest pain described as a pressure in the center of the chest, which worsens with exertion and does not radiate to the arms or jaw. This episode led to an emergency room visit. She has had high blood pressure in the past. Her family history is significant for heart disease, with her father and several siblings having had heart issues. She has never had a cardiac hx before.  She has a headache that began on Sunday, as noted by her brother. There is no further detail provided about the headache's characteristics or associated symptoms.  In the emergency room, her TSH level was slightly elevated, and her free T3 was slightly low. She has a history of thyroid  issues, with previous tests showing slightly high TSH levels. She has been on thyroid  medication for several years.  She received a knee injection last Wednesday for knee pain, which has been effective in reducing her pain. The knee pain is not as severe as it was before the injection.  She experiences shortness of breath but denies wheezing. She has a history of smoking, having smoked up to two-thirds of a pack per day for approximately 20 years, but quit smoking over 20 years ago. She has not used an inhaler before.  She was previously on omeprazole for acid reflux, which she stopped taking about eight to nine months ago when her prescription ran out. She has not refilled it since moving back  from [place] .  She lives independently and manages her own affairs. She does not feel particularly anxious and reports that she was not especially anxious on the day of the emergency room visit.          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 Misenheimer and Lewis   AVM (arteriovenous malformation)    small intestines   DNR (do not resuscitate)    Dyslipidemia    Essential hypertension  Former smoker    Gait disorder    Multifactorial   Hypothyroidism    Obesity    Osteopenia    Pernicious anemia    Social History   Tobacco Use   Smoking status: Former    Current packs/day: 0.00    Types: Cigarettes    Quit date: 11/09/2005    Years since quitting: 18.9    Passive exposure: Past   Smokeless tobacco: Never   Tobacco comments:    1-2 ppd  Vaping Use   Vaping status: Never Used  Substance Use Topics   Alcohol use: Never   Drug use: Never   Allergies  Allergen Reactions   Aspirin      Contraindicated by h/o GI bleed   Codeine Nausea And Vomiting   Effexor Xr [Venlafaxine Hcl]      intolerant   Furosemide     intolerant   Gabapentin    Lidocaine  Hypertension   Loratadine Hives   Naproxen Hypertension    All Nsaids   Nsaids    Penicillin G Other (See Comments)    Mouth raw and yeast infection   Plendil [Felodipine] Other (See Comments)    constipation   Spiriva Respimat [Tiotropium Bromide]     Heart race   Tramadol Nausea Only      ROS Per HPI.    Objective:     BP 125/72   Pulse 69   Temp 98.1 F (36.7 C) (Oral)   Resp 16   Ht 5' 4.96 (1.65 m)   Wt 181 lb 3.2 oz (82.2 kg)   SpO2 98%   BMI 30.19 kg/m  BP Readings from Last 3 Encounters:  10/03/24 125/72  09/25/24 127/69  08/27/24 121/73   Wt Readings from Last 3 Encounters:  10/03/24 181 lb 3.2 oz (82.2 kg)  09/25/24 185 lb 14.4 oz (84.3 kg)  08/27/24 185 lb 14.4 oz (84.3 kg)   SpO2 Readings from Last 3 Encounters:  10/03/24 98%  09/25/24 94%  08/27/24 94%      Physical Exam Constitutional:      General: She is not in acute distress.    Appearance: Normal appearance. She is not ill-appearing.  HENT:     Head: Normocephalic and atraumatic.     Nose: Nose normal.  Eyes:     General: No scleral icterus.    Conjunctiva/sclera: Conjunctivae normal.  Cardiovascular:     Rate and Rhythm: Normal rate and regular rhythm.     Heart sounds: Normal heart sounds. No murmur heard.    No friction rub.  Pulmonary:     Effort: Pulmonary effort is normal. No respiratory distress.     Breath sounds: Normal breath sounds. No wheezing, rhonchi or rales.  Musculoskeletal:        General: Normal range of motion.     Right lower leg: No edema.     Left lower leg: No edema.  Skin:    General: Skin is warm and dry.     Coloration: Skin is not jaundiced or pale.  Neurological:     General: No focal deficit present.     Mental Status: She is alert.  Psychiatric:        Mood and Affect: Mood normal.        Behavior: Behavior normal.      No results found for any visits on  10/03/24.  Last CBC Lab Results  Component Value Date   WBC 8.2 08/27/2024   HGB 13.3 08/27/2024  HCT 41.1 08/27/2024   MCV 101 (H) 08/27/2024   MCH 32.6 08/27/2024   RDW 12.4 08/27/2024   PLT 269 08/27/2024   Last metabolic panel Lab Results  Component Value Date   GLUCOSE 98 08/27/2024   NA 141 08/27/2024   K 4.0 08/27/2024   CL 103 08/27/2024   CO2 24 08/27/2024   BUN 22 08/27/2024   CREATININE 1.04 (H) 08/27/2024   EGFR 52 (L) 08/27/2024   CALCIUM 9.4 08/27/2024   PROT 6.3 08/27/2024   ALBUMIN 4.4 08/27/2024   LABGLOB 1.9 08/27/2024   BILITOT 0.3 08/27/2024   ALKPHOS 58 08/27/2024   AST 32 08/27/2024   ALT 23 08/27/2024   ANIONGAP 11 05/16/2023   Last lipids Lab Results  Component Value Date   CHOL 123 08/27/2024   HDL 51 08/27/2024   LDLCALC 46 08/27/2024   TRIG 152 (H) 08/27/2024   CHOLHDL 2.4 08/27/2024   Last hemoglobin A1c No results found for: HGBA1C    The ASCVD Risk score (Arnett DK, et al., 2019) failed to calculate for the following reasons:   The 2019 ASCVD risk score is only valid for ages 19 to 67    Assessment & Plan:   Assessment and Plan    Chest pain and exertional shortness of breath Intermittent chest pain and exertional shortness of breath. Chest pain described as pressure in the center of the chest, worsens with exertion, and is not radiating to arms or jaw. Troponin levels were undetectable, reducing suspicion for acute cardiac event. Differential includes cardiac issues, anxiety, and GERD. Family history of heart disease. No significant cardiac history reported. - Restarted pantoprazole  for potential GERD contribution to chest pain. - Send cardiology referral - Advised to seek emergency care if chest pain radiates to arm or jaw, is associated with nausea, or occurs with shortness of breath.  Wheezing Mild wheezing noted on lung examination, possibly due to mucus or residual effects of smoking. No history of asthma or frequent  inhaler use. No recent pneumonia or significant lung disease. - Prescribed albuterol inhaler for trial use to assess for improvement in shortness of breath. - We discussed that she should let Alexis Stevenson know if she feels like this alleviates any breathing issues  Headache Intermittent headaches reported, possibly related to anxiety or other factors. No specific neurological deficits noted.  Hypothyroidism Slightly elevated TSH and low free T3 noted. Previous TSH levels were also slightly elevated. No significant symptoms directly attributed to thyroid  dysfunction.  I do not suspect that there is anything wrong here. - Ordered TSH and T4 tests to reassess thyroid  function.  Gastroesophageal reflux disease (GERD) GERD with previous use of omeprazole. Symptoms may contribute to chest pain. No current acid reflux medication use. - Prescribed pantoprazole  as an alternative to omeprazole.     I personally spent a total of 39 minutes in the care of the patient today including preparing to see the patient, getting/reviewing separately obtained history, performing a medically appropriate exam/evaluation, counseling and educating, placing orders, and documenting clinical information in the EHR.  Return in about 4 weeks (around 10/31/2024) for sob.    Marai Teehan T Josephyne Tarter, PA-C

## 2024-10-04 ENCOUNTER — Ambulatory Visit (HOSPITAL_BASED_OUTPATIENT_CLINIC_OR_DEPARTMENT_OTHER): Payer: Self-pay | Admitting: Student

## 2024-10-04 LAB — TSH+FREE T4
Free T4: 1.61 ng/dL (ref 0.82–1.77)
TSH: 5.52 u[IU]/mL — ABNORMAL HIGH (ref 0.450–4.500)

## 2024-10-15 ENCOUNTER — Ambulatory Visit: Payer: Self-pay

## 2024-10-15 NOTE — Telephone Encounter (Signed)
 FYI Only or Action Required?: FYI only for provider: appointment scheduled on 10/16/2024 at 11:10 AM.  Patient was last seen in primary care on 10/03/2024 by Rothfuss, Lang DASEN, PA-C.  Called Nurse Triage reporting Altered Mental Status (Confusion per assisted living staff. Daughter states patient has some baseline confusion).  Symptoms began yesterday.  Interventions attempted: Rest, hydration, or home remedies.  Symptoms are: unchanged.  Triage Disposition: See Physician Within 24 Hours (overriding See HCP Within 4 Hours (Or PCP Triage))  Patient/caregiver understands and will follow disposition?:   Copied from CRM #[id number]. Topic: Clinical - Red Word Triage >> Oct 15, 2024  9:00 AM Willma R wrote: [facility] that prompted transfer to Nurse Triage: Patient's daughter Shawnee on the line calling in regards to her mother, lives in an assisted living and they called to advise the patient is having some confusion, thinks she may have a UTI. Shawnee also states that her memory has gotten worse and is more forgetful. Reason for Disposition  [1] Longstanding confusion (e.g., dementia, stroke) AND [2] getting worse  Answer Assessment - Initial Assessment Questions Daughter was called today at [facility] where the patient lives. Staff is concerned that patient was more confused than normal. Staff is concerned for possible UTI. Unknown if patient is having any other symptoms-patient is able to use the bathroom without any issues. Daughter states she does feel like patient's memory is getting worse. Staff reports patient got upset with what a staff member said to her which the daughter states If someone gets my mom mad, she will not forget it. Patient will only see Dr. Limmie for tomorrow due to availability for daughter.   1. LEVEL OF CONSCIOUSNESS: How are they (the patient) acting right now? (e.g., alert-oriented, confused, lethargic, stuporous, comatose)     More confused  2. ONSET: When  did the confusion start?  (e.g., minutes, hours, days)     Increase in confusion starting yesterday  3. PATTERN: Does this come and go, or has it been constant since it started?  Is it present now?     Daughter is unsure due to information coming from assisted living 4. ALCOHOL or DRUGS: Have they been drinking alcohol or taking any drugs?      no 5. NARCOTIC MEDICINES: Have they been receiving any narcotic medications? (e.g., morphine, Vicodin)     no 6. CAUSE: What do you think is causing the confusion?      Daughter and assisted living staff are concerned for possible UTI 7. OTHER SYMPTOMS: Are there any other symptoms? (e.g., difficulty breathing, fever, headache, weakness)     Memory loss,  Protocols used: Confusion - Delirium-A-AH

## 2024-10-16 ENCOUNTER — Ambulatory Visit (HOSPITAL_BASED_OUTPATIENT_CLINIC_OR_DEPARTMENT_OTHER): Admitting: Family Medicine

## 2024-10-21 ENCOUNTER — Ambulatory Visit: Admitting: Cardiology

## 2024-10-24 ENCOUNTER — Encounter: Payer: Self-pay | Admitting: Cardiology

## 2024-10-24 ENCOUNTER — Ambulatory Visit: Attending: Cardiology | Admitting: Cardiology

## 2024-10-24 VITALS — BP 120/60 | HR 76 | Ht 64.96 in | Wt 182.0 lb

## 2024-10-24 DIAGNOSIS — I10 Essential (primary) hypertension: Secondary | ICD-10-CM | POA: Diagnosis present

## 2024-10-24 DIAGNOSIS — I209 Angina pectoris, unspecified: Secondary | ICD-10-CM | POA: Diagnosis present

## 2024-10-24 DIAGNOSIS — Z8249 Family history of ischemic heart disease and other diseases of the circulatory system: Secondary | ICD-10-CM | POA: Diagnosis present

## 2024-10-24 DIAGNOSIS — E039 Hypothyroidism, unspecified: Secondary | ICD-10-CM | POA: Diagnosis present

## 2024-10-24 MED ORDER — RANOLAZINE ER 500 MG PO TB12: 500.0000 mg | ORAL_TABLET | Freq: Two times a day (BID) | ORAL | 3 refills | Status: AC

## 2024-10-24 NOTE — Progress Notes (Unsigned)
 Cardiology Consultation:    Date:  10/24/2024   ID:  Alexis Stevenson, DOB 02-12-1934, MRN [id number]  PCP:  Dottie Norleen PHEBE PONCE, MD  Cardiologist:  Lamar Fitch, MD   Referring MD: Iven Lang DASEN, PA-C   No chief complaint on file. I have a chest pain  History of Present Illness:    Alexis Stevenson is a 89 y.o. female who is being seen today for the evaluation of chest pain at the request of Rothfuss, Lang DASEN, PA-C.  Past medical history significant for essential hypertension, dyslipidemia.  She is a retired engineer, civil (consulting) does have some memory show she comes to our office with her daughter who participates in decision-making.  She described to have some chest pain sometimes with exercise.  Cannot recall exact details but there is no shortness of breath there is no sweating there is no dizziness associated with this sensation.  He is joking about the fact that she is fine.  She never had any heart trouble she does have dyslipidemia, she also have history of GI bleed he is to be on aspirin  aspirin  has been withdrawn because of GI bleed that required transfusion.  She is smoke but quit long time ago does have significant family history of premature coronary artery disease.  That includes multiple family members including her children.  Does not exercise on the regular basis, spent time watching TV but like to travel and recently she went to see nutcracker in [facility]  Past Medical History:  Diagnosis Date   Anemia    Iron deficiency known to Drs. Misenheimer and Lewis   AVM (arteriovenous malformation)    small intestines   DNR (do not resuscitate)    Dyslipidemia    Essential hypertension    Former smoker    Gait disorder    Multifactorial   Hypothyroidism    Obesity    Osteopenia    Pernicious anemia     Past Surgical History:  Procedure Laterality Date   APPENDECTOMY     CATARACT EXTRACTION     CERVICAL SPINE SURGERY     rod in place, C2-7   CESAREAN SECTION      BTL   CHOLECYSTECTOMY     HYSTERECTOMY ABDOMINAL WITH SALPINGO-OOPHORECTOMY N/A    Noncancerous reasons   REPLACEMENT TOTAL KNEE Right    x 2   SHOULDER ARTHROSCOPY Right 2012   TONSILLECTOMY     TOTAL HIP ARTHROPLASTY Bilateral [id number]   TOTAL HIP REVISION Right 05/29/2023   Procedure: RIGHT TOTAL HIP REVISION POSTERIOR APPROACH;  Surgeon: Liam Lerner, MD;  Location: [facility];  Service: Orthopedics;  Laterality: Right;    Current Medications: Active Medications[1]   Allergies:   Aspirin , Codeine, Effexor xr [venlafaxine hcl], Furosemide, Gabapentin, Lidocaine , Loratadine, Naproxen, Nsaids, Penicillin g, Plendil [felodipine], Spiriva respimat [tiotropium bromide], and Tramadol   Social History   Socioeconomic History   Marital status: Widowed    Spouse name: Lives at [facility]   Number of children: 4   Years of education: Not on file   Highest education level: Associate degree: occupational, scientist, product/process development, or vocational program  Occupational History    Comment: retired PUBLIC HOUSE MANAGER, airline pilot  Tobacco Use   Smoking status: Former    Current packs/day: 0.00    Average packs/day: 0.5 packs/day    Types: Cigarettes    Quit date: 11/09/2005    Years since quitting: 18.9    Passive exposure: Past   Smokeless tobacco: Never   Tobacco  comments:    1-2 ppd  Vaping Use   Vaping status: Never Used  Substance and Sexual Activity   Alcohol use: Never   Drug use: Never   Sexual activity: Not on file  Other Topics Concern   Not on file  Social History Narrative   Lives alone   Caffeine- 1 c daily   Social Drivers of Health   Tobacco Use: Medium Risk (10/24/2024)   Patient History    Smoking Tobacco Use: Former    Smokeless Tobacco Use: Never    Passive Exposure: Past  Physicist, Medical Strain: Not on file  Food Insecurity: No Food Insecurity (05/29/2023)   Hunger Vital Sign    Worried About Running Out of Food in the Last Year: Never true    Ran Out of Food in the Last  Year: Never true  Transportation Needs: No Transportation Needs (05/29/2023)   PRAPARE - Administrator, Civil Service (Medical): No    Lack of Transportation (Non-Medical): No  Physical Activity: Not on file  Stress: Not on file  Social Connections: Not on file  Depression (PHQ2-9): Low Risk (07/30/2024)   Depression (PHQ2-9)    PHQ-2 Score: 3  Alcohol Screen: Not on file  Housing: Low Risk (05/29/2023)   Housing    Last Housing Risk Score: 0  Utilities: Not At Risk (05/29/2023)   AHC Utilities    Threatened with loss of utilities: No  Health Literacy: Not on file     Family History: The patient's family history includes Heart attack in her father; Heart disease in her mother; Hypertension in her mother; Lung cancer in her brother; Stroke in her mother. ROS:   Please see the history of present illness.    All 14 point review of systems negative except as described per history of present illness.  EKGs/Labs/Other Studies Reviewed:    The following studies were reviewed today:   EKG:  EKG Interpretation Date/Time:  Thursday October 24 2024 13:09:47 EST Ventricular Rate:  76 PR Interval:  142 QRS Duration:  76 QT Interval:  380 QTC Calculation: 427 R Axis:   3  Text Interpretation: Normal sinus rhythm with sinus arrhythmia Normal ECG When compared with ECG of 16-May-2023 10:35, No significant change was found Confirmed by Bernie Charleston [phone]) on 10/24/2024 1:20:00 PM    Recent Labs: 08/27/2024: ALT 23; BUN 22; Creatinine, Ser 1.04; Hemoglobin 13.3; Platelets 269; Potassium 4.0; Sodium 141 10/03/2024: TSH 5.520  Recent Lipid Panel    Component Value Date/Time   CHOL 123 08/27/2024 1408   TRIG 152 (H) 08/27/2024 1408   HDL 51 08/27/2024 1408   CHOLHDL 2.4 08/27/2024 1408   LDLCALC 46 08/27/2024 1408    Physical Exam:    VS:  BP 120/60   Pulse 76   Ht 5' 4.96 (1.65 m)   Wt 182 lb (82.6 kg)   SpO2 96%   BMI 30.32 kg/m     Wt Readings from Last  3 Encounters:  10/24/24 182 lb (82.6 kg)  10/03/24 181 lb 3.2 oz (82.2 kg)  09/25/24 185 lb 14.4 oz (84.3 kg)     GEN:  Well nourished, well developed in no acute distress HEENT: Normal NECK: No JVD; No carotid bruits LYMPHATICS: No lymphadenopathy CARDIAC: RRR, no murmurs, no rubs, no gallops RESPIRATORY:  Clear to auscultation without rales, wheezing or rhonchi  ABDOMEN: Soft, non-tender, non-distended MUSCULOSKELETAL:  No edema; No deformity  SKIN: Warm and dry NEUROLOGIC:  Alert and oriented x  3 PSYCHIATRIC:  Normal affect   ASSESSMENT:    1. Primary hypertension   2. Angina pectoris   3. Acquired hypothyroidism    PLAN:    In order of problems listed above:  Chest pain very suspicion for angina pectoris.  When I put her on antiplatelets therapy she does not want to do it because of history of GI bleed that required some blood transfusion.  We are talking about potential alternative of Plavix however she does not want to do it if truly this is angina pectoris looks like this is a stable pattern.  We elected to do workup first and then if it comes positive then we go ahead with antiplatelets therapy.  In the meantime ask her to start taking ranolazine 500 mg twice daily, we will schedule her to have a calcium score if calcium score is very high then we do stress test if calcium score selectively low then we will proceed with coronary CT angio. Essential hypertension blood pressure well-controlled continue present management. Dyslipidemia I did review KPN which showed me LDL 46 HDL 51 we will continue present management.   Medication Adjustments/Labs and Tests Ordered: Current medicines are reviewed at length with the patient today.  Concerns regarding medicines are outlined above.  Orders Placed This Encounter  Procedures   EKG 12-Lead   No orders of the defined types were placed in this encounter.   Signed, Lamar DOROTHA Fitch, MD, [facility]. 10/24/2024 1:43 PM    [facility]     [1]  Current Meds  Medication Sig   albuterol  (VENTOLIN  HFA) 108 (90 Base) MCG/ACT inhaler Inhale 2 puffs into the lungs every 6 (six) hours as needed for wheezing or shortness of breath.   Cholecalciferol 50 MCG (2000 UT) TABS Take 2,000 Units by mouth daily.   cyanocobalamin  1000 MCG tablet Take 1,000 mcg by mouth daily.   diphenhydramine -acetaminophen  (TYLENOL  PM) 25-500 MG TABS tablet Take 1 tablet by mouth at bedtime. Nightly as needed   ferrous sulfate  325 (65 FE) MG tablet Take 325 mg by mouth daily.   levothyroxine  (SYNTHROID ) 100 MCG tablet Take 100 mcg by mouth daily before breakfast.   Multiple Vitamins-Minerals (CENTRUM SILVER 50+WOMEN) TABS Take 1 tablet by mouth daily.   olmesartan-hydrochlorothiazide (BENICAR HCT) 20-12.5 MG tablet Take 1 tablet by mouth daily.   pantoprazole  (PROTONIX ) 40 MG tablet Take 1 tablet (40 mg total) by mouth daily.   rosuvastatin (CRESTOR) 20 MG tablet Take 20 mg by mouth daily.   senna (SENOKOT) 8.6 MG tablet Take 1 tablet by mouth as needed for constipation. (Patient taking differently: Take 1 tablet by mouth daily.)   Vitamin D, Ergocalciferol, (DRISDOL) 1.25 MG (50000 UNIT) CAPS capsule Take 50,000 Units by mouth 2 (two) times a week.

## 2024-10-24 NOTE — Patient Instructions (Addendum)
 Medication Instructions:   START: Ranolazine 500mg  1 tablet twice daily   Lab Work: None Ordered If you have labs (blood work) drawn today and your tests are completely normal, you will receive your results only by: MyChart Message (if you have MyChart) OR A paper copy in the mail If you have any lab test that is abnormal or we need to change your treatment, we will call you to review the results.   Testing/Procedures: We will order CT coronary calcium score. It will cost $99.00 and is not covered by insurance.  Please call to schedule.    [facility] [address] [place], [place] [address] [phone]   Follow-Up: At [facility], you and your health needs are our priority.  As part of our continuing mission to provide you with exceptional heart care, we have created designated Provider Care Teams.  These Care Teams include your primary Cardiologist (physician) and Advanced Practice Providers (APPs -  Physician Assistants and Nurse Practitioners) who all work together to provide you with the care you need, when you need it.  We recommend signing up for the patient portal called MyChart.  Sign up information is provided on this After Visit Summary.  MyChart is used to connect with patients for Virtual Visits (Telemedicine).  Patients are able to view lab/test results, encounter notes, upcoming appointments, etc.  Non-urgent messages can be sent to your provider as well.   To learn more about what you can do with MyChart, go to [email or web address].    Your next appointment:   1 month(s)  The format for your next appointment:   In Person  Provider:   Lamar Fitch, MD    Other Instructions NA

## 2024-10-26 ENCOUNTER — Other Ambulatory Visit (HOSPITAL_BASED_OUTPATIENT_CLINIC_OR_DEPARTMENT_OTHER): Payer: Self-pay | Admitting: Student

## 2024-10-26 DIAGNOSIS — R062 Wheezing: Secondary | ICD-10-CM

## 2024-10-28 ENCOUNTER — Other Ambulatory Visit (HOSPITAL_BASED_OUTPATIENT_CLINIC_OR_DEPARTMENT_OTHER): Admitting: Radiology

## 2024-10-28 ENCOUNTER — Telehealth (HOSPITAL_BASED_OUTPATIENT_CLINIC_OR_DEPARTMENT_OTHER): Payer: Self-pay

## 2024-10-28 NOTE — Telephone Encounter (Signed)
 Called pt concerning a refill we received for albuterol  from [facility]. She states she does not need refill. She does not think she is using the inhaler. Pt advised to call us  if she feels that she needs it.

## 2024-10-31 ENCOUNTER — Encounter (HOSPITAL_BASED_OUTPATIENT_CLINIC_OR_DEPARTMENT_OTHER): Payer: Self-pay | Admitting: Family Medicine

## 2024-10-31 ENCOUNTER — Ambulatory Visit (INDEPENDENT_AMBULATORY_CARE_PROVIDER_SITE_OTHER): Admitting: Family Medicine

## 2024-10-31 VITALS — BP 124/74 | HR 82 | Temp 98.5°F | Resp 16 | Wt 179.3 lb

## 2024-10-31 DIAGNOSIS — E785 Hyperlipidemia, unspecified: Secondary | ICD-10-CM | POA: Insufficient documentation

## 2024-10-31 DIAGNOSIS — M8580 Other specified disorders of bone density and structure, unspecified site: Secondary | ICD-10-CM | POA: Insufficient documentation

## 2024-10-31 DIAGNOSIS — N1831 Chronic kidney disease, stage 3a: Secondary | ICD-10-CM | POA: Diagnosis not present

## 2024-10-31 DIAGNOSIS — Z87898 Personal history of other specified conditions: Secondary | ICD-10-CM | POA: Diagnosis not present

## 2024-10-31 DIAGNOSIS — I10 Essential (primary) hypertension: Secondary | ICD-10-CM | POA: Diagnosis not present

## 2024-10-31 NOTE — Progress Notes (Signed)
 Established Patient Office Visit  Subjective   Patient ID: Alexis Stevenson, female    DOB: [birth year]  Age: 89 y.o. MRN: [id number]  Chief Complaint  Patient presents with   Follow-up    Follow-up    Discussed the use of AI scribe software for clinical note transcription with the patient, who gave verbal consent to proceed.  History of Present Illness Alexis Stevenson is an 89 year old female who presents for a follow-up regarding her heart health and medication management.  Dr. Karry last note appreciated.  Patient was a bit flustered when she was asked for money up front for his recommended Calcium score scan.  I advised her and her daughter to please make Dr. Bernie aware of her concerns in hopes that she can follow his recommendations.  She is currently taking rosuvastatin for cholesterol management. There is a prescription for a generic form of Plavix that has been ordered but not yet picked up.  Her granddaughter picks up her medication refills when notified by the pharmacy. She takes B12 supplements orally.    Past Medical History:  Diagnosis Date   Anemia    Iron deficiency known to Drs. Misenheimer and Lewis   Angina pectoris    f/by Dr. Bernie   AVM (arteriovenous malformation)    small intestines   DNR (do not resuscitate)    Dyslipidemia    Essential hypertension    Former smoker    Gait disorder    Multifactorial   Hypothyroidism    Obesity    Osteopenia    Pernicious anemia     Outpatient Encounter Medications as of 10/31/2024  Medication Sig   albuterol  (VENTOLIN  HFA) 108 (90 Base) MCG/ACT inhaler Inhale 2 puffs into the lungs every 6 (six) hours as needed for wheezing or shortness of breath.   Cholecalciferol 50 MCG (2000 UT) TABS Take 2,000 Units by mouth daily.   cyanocobalamin  1000 MCG tablet Take 1,000 mcg by mouth daily.   diphenhydramine -acetaminophen  (TYLENOL  PM) 25-500 MG TABS tablet Take 1 tablet by mouth at bedtime. Nightly  as needed   ferrous sulfate  325 (65 FE) MG tablet Take 325 mg by mouth daily.   levothyroxine  (SYNTHROID ) 100 MCG tablet Take 100 mcg by mouth daily before breakfast.   Multiple Vitamins-Minerals (CENTRUM SILVER 50+WOMEN) TABS Take 1 tablet by mouth daily.   olmesartan-hydrochlorothiazide (BENICAR HCT) 20-12.5 MG tablet Take 1 tablet by mouth daily.   pantoprazole  (PROTONIX ) 40 MG tablet Take 1 tablet (40 mg total) by mouth daily.   rosuvastatin (CRESTOR) 20 MG tablet Take 20 mg by mouth daily.   Vitamin D, Ergocalciferol, (DRISDOL) 1.25 MG (50000 UNIT) CAPS capsule Take 50,000 Units by mouth 2 (two) times a week.   ranolazine  (RANEXA ) 500 MG 12 hr tablet Take 1 tablet (500 mg total) by mouth 2 (two) times daily.   senna (SENOKOT) 8.6 MG tablet Take 1 tablet by mouth as needed for constipation. (Patient taking differently: Take 1 tablet by mouth daily.)   No facility-administered encounter medications on file as of 10/31/2024.    Social History[1]    Review of Systems  Constitutional:  Negative for diaphoresis, fever, malaise/fatigue and weight loss.  Respiratory:  Negative for cough, shortness of breath and wheezing.   Cardiovascular:  Negative for chest pain, palpitations, orthopnea, claudication, leg swelling and PND.      Objective:     BP 124/74 (Cuff Size: Normal)   Pulse 82   Temp 98.5 F (36.9 C) (Oral)  Resp 16   Wt 179 lb 4.8 oz (81.3 kg)   SpO2 94%   BMI 29.87 kg/m    Physical Exam Constitutional:      General: She is not in acute distress.    Appearance: Normal appearance.     Comments: Gait a bit stiff with cane used.  However overall she gets around well.  HENT:     Head: Normocephalic.  Cardiovascular:     Rate and Rhythm: Normal rate and regular rhythm.     Pulses: Normal pulses.     Heart sounds: Normal heart sounds.  Pulmonary:     Effort: Pulmonary effort is normal.     Breath sounds: Normal breath sounds.  Abdominal:     General: Bowel sounds  are normal.     Palpations: Abdomen is soft.  Musculoskeletal:     Cervical back: Neck supple. No tenderness.     Right lower leg: No edema.     Left lower leg: No edema.  Neurological:     Mental Status: She is alert.      No results found for any visits on 10/31/24.    The ASCVD Risk score (Arnett DK, et al., 2019) failed to calculate for the following reasons:   The 2019 ASCVD risk score is only valid for ages 16 to 65   * - Cholesterol units were assumed    Assessment & Plan:   Assessment & Plan Primary hypertension Satisfactory control.  DASH diet.  Continue Benicar/HCT.     Stage 3a chronic kidney disease (HCC) Continue strict NSAID avoidance.  HTN control.     History of chest pain Discussion about the calcium score test ordered by Dr. Bernie to assess heart health and determine the aggressiveness of treatment. She continues to have reservations about the up front payment, and was advised (as was her daughter), to discuss further with Dr. Bernie.  I readily agreed with his recommendations.  She has Medicare and private insurance, which typically cover most expenses, but there may still be some out-of-pocket costs.    Osteopenia, unspecified location Dexascan ordered at a relatively recent visit.  Our staff was directed on making sure the patient and her daughter understand when this is scheduled.     Dyslipidemia Lipids are fine.  Continue Rosuvastatin.        Return in about 3 months (around 01/29/2025) for chronic follow-up.    REDDING PONCE NORLEEN FALCON., MD    [1]  Social History Tobacco Use   Smoking status: Former    Current packs/day: 0.00    Average packs/day: 0.5 packs/day    Types: Cigarettes    Quit date: 11/09/2005    Years since quitting: 18.9    Passive exposure: Past   Smokeless tobacco: Never   Tobacco comments:    1-2 ppd  Vaping Use   Vaping status: Never Used  Substance Use Topics   Alcohol use: Never   Drug use: Never

## 2024-10-31 NOTE — Assessment & Plan Note (Signed)
 Discussion about the calcium score test ordered by Dr. Bernie to assess heart health and determine the aggressiveness of treatment. She continues to have reservations about the up front payment, and was advised (as was her daughter), to discuss further with Dr. Bernie.  I readily agreed with his recommendations.  She has Medicare and private insurance, which typically cover most expenses, but there may still be some out-of-pocket costs.

## 2024-10-31 NOTE — Assessment & Plan Note (Addendum)
 Satisfactory control.  DASH diet.  Continue Benicar/HCT.

## 2024-10-31 NOTE — Assessment & Plan Note (Addendum)
 Dexascan ordered at a relatively recent visit.  Our staff was directed on making sure the patient and her daughter understand when this is scheduled.

## 2024-10-31 NOTE — Assessment & Plan Note (Addendum)
 Lipids are fine.  Continue Rosuvastatin.

## 2024-10-31 NOTE — Assessment & Plan Note (Addendum)
 Continue strict NSAID avoidance.  HTN control.

## 2024-11-12 ENCOUNTER — Ambulatory Visit (HOSPITAL_BASED_OUTPATIENT_CLINIC_OR_DEPARTMENT_OTHER)
Admission: RE | Admit: 2024-11-12 | Discharge: 2024-11-12 | Disposition: A | Discharge status: Home / Self Care | Source: Ambulatory Visit | Attending: Family Medicine | Admitting: Family Medicine

## 2024-11-12 DIAGNOSIS — M85832 Other specified disorders of bone density and structure, left forearm: Secondary | ICD-10-CM | POA: Diagnosis not present

## 2024-11-12 DIAGNOSIS — M8580 Other specified disorders of bone density and structure, unspecified site: Secondary | ICD-10-CM

## 2024-11-15 ENCOUNTER — Ambulatory Visit (HOSPITAL_BASED_OUTPATIENT_CLINIC_OR_DEPARTMENT_OTHER)
Admission: RE | Admit: 2024-11-15 | Discharge: 2024-11-15 | Disposition: A | Discharge status: Home / Self Care | Source: Home / Self Care

## 2024-11-15 ENCOUNTER — Encounter (HOSPITAL_BASED_OUTPATIENT_CLINIC_OR_DEPARTMENT_OTHER): Payer: Self-pay

## 2024-11-15 ENCOUNTER — Ambulatory Visit (HOSPITAL_BASED_OUTPATIENT_CLINIC_OR_DEPARTMENT_OTHER): Admitting: Radiology

## 2024-11-15 ENCOUNTER — Ambulatory Visit: Payer: Self-pay

## 2024-11-15 VITALS — BP 127/57 | HR 75 | Temp 97.9°F | Resp 16

## 2024-11-15 DIAGNOSIS — M533 Sacrococcygeal disorders, not elsewhere classified: Secondary | ICD-10-CM | POA: Diagnosis not present

## 2024-11-15 NOTE — Telephone Encounter (Signed)
 FYI Only or Action Required?: FYI only for provider: pt will go to UC for injury. Made appt for memory loss on Monday.  Patient was last seen in primary care on 10/31/2024 by Dottie Norleen PHEBE PONCE, MD.  Called Nurse Triage reporting Fall. Tailbone injury, memory loss  Symptoms began several days ago.  Interventions attempted: Nothing.  Symptoms are: gradually worsening.  Triage Disposition: See Physician Within 24 Hours  Patient/caregiver understands and will follow disposition?: Yes                        Copied from CRM #[id number]. Topic: Clinical - Red Word Triage >> Nov 15, 2024  8:33 AM Rea BROCKS wrote: Red Word that prompted transfer to Nurse Triage: Patient fell on Tuesday, fell on butt. She told daughter that she has knot on the base of her spine. It may be a bruise. Patient was experiencing severe pain. Reason for Disposition  [1] MODERATE pain (e.g., interferes with normal activities) AND [2] high-risk adult (e.g., age > 60 years, osteoporosis, chronic steroid use)  Answer Assessment - Initial Assessment Questions S/w daughter  - she was not with pt at the time.  1. MECHANISM: How did the injury happen?       Unsure - may have slipped off of a chair  - or may have fallen backwards while on knees 2. ONSET: When did the injury happen? (e.g., minutes, hours ago)      Tuesday 3. LOCATION: Where is the injury located?      Tailbone 4. SEVERITY: Can you sit? Can you walk?      pain 5. PAIN: Is there pain? If Yes, ask: How bad is the pain?  (Scale 0-10; none, mild, moderate, or severe)     Moderate - severe 6. SIZE: For bruises, or swelling, ask: How large is it? (e.g., inches or centimeters)      Unsure may have a bruise 7. OTHER SYMPTOMS: Do you have any other symptoms? (e.g., numbness, back pain)     Memory issues  Protocols used: Tailbone Injury-A-AH

## 2024-11-15 NOTE — ED Triage Notes (Addendum)
 Pt present a fall two days ago, pt possible injured her tail bone. Pt states it hard to sit down and get back up. Pt state fall backwards on her bottom.

## 2024-11-15 NOTE — ED Provider Notes (Signed)
 " Alexis Stevenson    CSN: [id number] Arrival date & time: 11/15/24  9073      History   Chief Complaint Chief Complaint  Patient presents with   Fall    HPI Alexis Stevenson is a 89 y.o. female.   Patient is an 89 year old female that presents today for tailbone pain. Pt reports that she had a fall two days ago and injured her tail bone. Pt states it hard to sit down and get back up. Pt state fall backwards on her bottom. She is taking tylenol  for pain.     Fall    Past Medical History:  Diagnosis Date   Anemia    Iron deficiency known to Drs. Misenheimer and Lewis   Angina pectoris    f/by Dr. 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   Iron deficiency anemia due to chronic blood loss 03/03/2021   CKD (chronic kidney disease), stage III (HCC) 02/06/2014   GERD (gastroesophageal reflux disease) 02/06/2014   Hypertension 02/06/2014   Hypothyroidism 02/06/2014   Obesity (BMI 30-39.9) 02/06/2014    Past Surgical History:  Procedure Laterality Date   APPENDECTOMY     CATARACT EXTRACTION     CERVICAL SPINE SURGERY     rod in place, C2-7   CESAREAN SECTION     BTL   CHOLECYSTECTOMY     HYSTERECTOMY ABDOMINAL WITH SALPINGO-OOPHORECTOMY N/A    Noncancerous reasons   REPLACEMENT TOTAL KNEE Right    x 2   SHOULDER ARTHROSCOPY Right 2012   TONSILLECTOMY      TOTAL HIP ARTHROPLASTY Bilateral 7983,7982   TOTAL HIP REVISION Right 05/29/2023   Procedure: RIGHT TOTAL HIP REVISION POSTERIOR APPROACH;  Surgeon: Liam Lerner, MD;  Location: [facility];  Service: Orthopedics;  Laterality: Right;    OB History   No obstetric history on file.      Home Medications    Prior to Admission medications  Medication Sig Start Date End Date Taking? Authorizing Provider  albuterol  (VENTOLIN  HFA) 108 (90 Base) MCG/ACT inhaler Inhale 2 puffs into the lungs every 6 (six) hours as needed for wheezing or shortness of breath. 10/03/24   Rothfuss, Jacob T, PA-C  Cholecalciferol 50 MCG (2000 UT) TABS Take 2,000 Units by mouth daily.    [provider]  cyanocobalamin  1000 MCG tablet Take 1,000 mcg by mouth daily.    [provider]  diphenhydramine -acetaminophen  (TYLENOL  PM) 25-500 MG TABS tablet Take 1 tablet by mouth at bedtime. Nightly as needed    [provider]  ferrous sulfate  325 (65 FE) MG tablet Take 325 mg by mouth daily.    [provider]  levothyroxine  (SYNTHROID ) 100 MCG tablet Take 100 mcg by mouth daily before breakfast. 08/24/20   [provider]  Multiple  Vitamins-Minerals (CENTRUM SILVER 50+WOMEN) TABS Take 1 tablet by mouth daily.    [provider]  olmesartan-hydrochlorothiazide (BENICAR HCT) 20-12.5 MG tablet Take 1 tablet by mouth daily. 06/11/24   [provider]  pantoprazole  (PROTONIX ) 40 MG tablet Take 1 tablet (40 mg total) by mouth daily. 10/03/24   Rothfuss, Jacob T, PA-C  ranolazine  (RANEXA ) 500 MG 12 hr tablet Take 1 tablet (500 mg total) by mouth 2 (two) times daily. 10/24/24   Krasowski, Robert J, MD  rosuvastatin (CRESTOR) 20 MG tablet Take 20 mg by mouth daily.    [provider]  senna (SENOKOT) 8.6 MG tablet Take 1 tablet by mouth as needed for constipation. Patient taking differently: Take 1 tablet by mouth daily.    [provider]  Vitamin D,  Ergocalciferol, (DRISDOL) 1.25 MG (50000 UNIT) CAPS capsule Take 50,000 Units by mouth 2 (two) times a week. 09/17/20   [provider]    Family History Family History  Problem Relation Age of Onset   Stroke Mother    Heart disease Mother    Hypertension Mother    Heart attack Father        age 89's   Lung cancer Brother     Social History Social History[1]   Allergies   Aspirin , Codeine, Effexor xr [venlafaxine hcl], Furosemide, Gabapentin, Lidocaine , Loratadine, Naproxen, Nsaids, Penicillin g, Plendil [felodipine], Spiriva respimat [tiotropium bromide], and Tramadol   Review of Systems Review of Systems  See HPI Physical Exam Triage Vital Signs ED Triage Vitals  Encounter Vitals Group     BP 11/15/24 0944 (!) 127/57     Girls Systolic BP Percentile --      Girls Diastolic BP Percentile --      Boys Systolic BP Percentile --      Boys Diastolic BP Percentile --      Pulse Rate 11/15/24 0944 75     Resp 11/15/24 0944 16     Temp 11/15/24 0944 97.9 F (36.6 C)     Temp Source 11/15/24 0944 Oral     SpO2 11/15/24 0944 96 %     Weight --      Height --      Head Circumference --      Peak Flow --      Pain Score 11/15/24 0942 5     Pain Loc --      Pain Education --      Exclude from Growth Chart --    No data found.  Updated Vital Signs BP (!) 127/57 (BP Location: Right Arm)   Pulse 75   Temp 97.9 F (36.6 C) (Oral)   Resp 16   SpO2 96%   Visual Acuity Right Eye Distance:   Left Eye Distance:   Bilateral Distance:    Right Eye Near:   Left Eye Near:    Bilateral Near:     Physical Exam Vitals and nursing note reviewed.  Constitutional:      General: She is not in acute distress.    Appearance: Normal appearance. She is not ill-appearing, toxic-appearing or diaphoretic.  Pulmonary:     Effort: Pulmonary effort is normal.  Musculoskeletal:       Legs:  Neurological:     Mental Status: She is alert.  Psychiatric:        Mood and  Affect: Mood normal.      UC Treatments / Results  Labs (all labs ordered are listed, but only abnormal results are  displayed) Labs Reviewed - No data to display  EKG   Radiology DG Sacrum/Coccyx Result Date: 11/15/2024 CLINICAL DATA:  Tail bone pain after fall EXAM: SACRUM AND COCCYX - 2+ VIEW COMPARISON:  April 27, 2016 FINDINGS: There is no evidence of fracture or other focal bone lesions. IMPRESSION: Negative. Electronically Signed   By: Lynwood Landy Raddle M.D.   On: 11/15/2024 10:56    Procedures Procedures (including critical Stevenson time)  Medications Ordered in UC Medications - No data to display  Initial Impression / Assessment and Plan / UC Course  I have reviewed the triage vital signs and the nursing notes.  Pertinent labs & imaging results that were available during my Stevenson of the patient were reviewed by me and considered in my medical decision making (see chart for details).     Sacral pain-x-ray without any acute fracture.  Most likely bruise.  She can continue with Exer strength Tylenol  for pain every 8 hours as needed.  Recommend purchasing a cushion for the bottom for support.  Lidocaine  patch to the area and ice.  Follow-up as needed Final Clinical Impressions(s) / UC Diagnoses   Final diagnoses:  Sacral pain     Discharge Instructions      No fractures on xray. You can take tylenol  extra strength every 8 hours. Bottom cushion.    ED Prescriptions   None    PDMP not reviewed this encounter.    [1]  Social History Tobacco Use   Smoking status: Former    Current packs/day: 0.00    Average packs/day: 0.5 packs/day    Types: Cigarettes    Quit date: 11/09/2005    Years since quitting: 19.0    Passive exposure: Past   Smokeless tobacco: Never   Tobacco comments:    1-2 ppd  Vaping Use   Vaping status: Never Used  Substance Use Topics   Alcohol use: Never   Drug use: Never     Adah Wilbert LABOR, FNP 11/15/24 1117  "

## 2024-11-15 NOTE — Discharge Instructions (Signed)
 No fractures on xray. You can take tylenol  extra strength every 8 hours. Bottom cushion.

## 2024-11-18 ENCOUNTER — Encounter (HOSPITAL_BASED_OUTPATIENT_CLINIC_OR_DEPARTMENT_OTHER): Payer: Self-pay | Admitting: Family Medicine

## 2024-11-18 ENCOUNTER — Ambulatory Visit (HOSPITAL_BASED_OUTPATIENT_CLINIC_OR_DEPARTMENT_OTHER): Admitting: Family Medicine

## 2024-11-18 VITALS — BP 114/72 | HR 74 | Temp 97.3°F | Resp 16 | Wt 178.5 lb

## 2024-11-18 DIAGNOSIS — S300XXS Contusion of lower back and pelvis, sequela: Secondary | ICD-10-CM | POA: Insufficient documentation

## 2024-11-18 DIAGNOSIS — Z87898 Personal history of other specified conditions: Secondary | ICD-10-CM

## 2024-11-18 DIAGNOSIS — M8580 Other specified disorders of bone density and structure, unspecified site: Secondary | ICD-10-CM

## 2024-11-18 NOTE — Assessment & Plan Note (Signed)
 Declines further Cardiac followup and clearly understands the risks.

## 2024-11-18 NOTE — Progress Notes (Signed)
 "  Established Patient Office Visit  Subjective   Patient ID: Alexis Stevenson, female    DOB: [birth year]  Age: 89 y.o. MRN: [id number]  Chief Complaint  Patient presents with   Tailbone Pain    Tailbone pain    Discussed the use of AI scribe software for clinical note transcription with the patient, who gave verbal consent to proceed.  History of Present Illness Alexis Stevenson is an 89 year old female who presents with tailbone pain after a fall.  She experiences tailbone pain following a fall from a stool that turned backwards, causing her to fall. The stool was not very high, minimizing the fall's impact.  She reports that she did not sustain any other injuries aside from tailbone pain. She uses a pain patch and takes Tylenol  for pain management, which she finds tolerable. She has not taken any oral ibuprofen.  An x-ray was performed, which showed no fractures or breaks in the tailbone area. She reports that her pain is tolerable with the use of a pain patch and Tylenol .    Past Medical History:  Diagnosis Date   Anemia    Iron deficiency known to Drs. Misenheimer and Lewis   AVM (arteriovenous malformation)    small intestines   DNR (do not resuscitate)    Dyslipidemia    Essential hypertension    Former smoker    Gait disorder    Multifactorial   History of chest pain    with Cardiology followup and testing declined.   Hypothyroidism    Obesity    Osteopenia    Pernicious anemia     Outpatient Encounter Medications as of 11/18/2024  Medication Sig   albuterol  (VENTOLIN  HFA) 108 (90 Base) MCG/ACT inhaler Inhale 2 puffs into the lungs every 6 (six) hours as needed for wheezing or shortness of breath.   Cholecalciferol 50 MCG (2000 UT) TABS Take 2,000 Units by mouth daily.   cyanocobalamin  1000 MCG tablet Take 1,000 mcg by mouth daily.   diphenhydramine -acetaminophen  (TYLENOL  PM) 25-500 MG TABS tablet Take 1 tablet by mouth at bedtime. Nightly as needed    ferrous sulfate  325 (65 FE) MG tablet Take 325 mg by mouth daily.   levothyroxine  (SYNTHROID ) 100 MCG tablet Take 100 mcg by mouth daily before breakfast.   Multiple Vitamins-Minerals (CENTRUM SILVER 50+WOMEN) TABS Take 1 tablet by mouth daily.   olmesartan-hydrochlorothiazide (BENICAR HCT) 20-12.5 MG tablet Take 1 tablet by mouth daily.   rosuvastatin (CRESTOR) 20 MG tablet Take 20 mg by mouth daily.   Vitamin D, Ergocalciferol, (DRISDOL) 1.25 MG (50000 UNIT) CAPS capsule Take 50,000 Units by mouth 2 (two) times a week.   pantoprazole  (PROTONIX ) 40 MG tablet Take 1 tablet (40 mg total) by mouth daily. (Patient not taking: Reported on 11/18/2024)   ranolazine  (RANEXA ) 500 MG 12 hr tablet Take 1 tablet (500 mg total) by mouth 2 (two) times daily. (Patient not taking: Reported on 11/18/2024)   senna (SENOKOT) 8.6 MG tablet Take 1 tablet by mouth as needed for constipation. (Patient taking differently: Take 1 tablet by mouth daily.)   No facility-administered encounter medications on file as of 11/18/2024.    Social History[1]    ROS    Objective:     BP 114/72 (BP Location: Right Arm, Patient Position: Standing, Cuff Size: Normal)   Pulse 74   Temp (!) 97.3 F (36.3 C) (Oral)   Resp 16   Wt 178 lb 8 oz (81 kg)   SpO2  96%   BMI 29.74 kg/m    Physical Exam Constitutional:      General: She is not in acute distress.    Appearance: Normal appearance.     Comments: Frail, comfortable.  Uses a cane.  HENT:     Head: Normocephalic.  Neck:     Vascular: No carotid bruit.  Cardiovascular:     Rate and Rhythm: Normal rate and regular rhythm.     Pulses: Normal pulses.     Heart sounds: Normal heart sounds.  Pulmonary:     Effort: Pulmonary effort is normal.     Breath sounds: Normal breath sounds.  Abdominal:     General: Bowel sounds are normal.     Palpations: Abdomen is soft.  Musculoskeletal:     Cervical back: Neck supple. No tenderness.     Right lower leg: No edema.      Left lower leg: No edema.     Comments: Lower back with mild sacral discomfort, other nontender.  Neurological:     General: No focal deficit present.     Mental Status: She is alert.      No results found for any visits on 11/18/24.    The ASCVD Risk score (Arnett DK, et al., 2019) failed to calculate for the following reasons:   The 2019 ASCVD risk score is only valid for ages 82 to 83   * - Cholesterol units were assumed    Assessment & Plan:   Assessment & Plan Coccygeal contusion, sequela Following a minor fall from a stool. No fractures or dislocations on x-ray. Pain is localized to the tailbone and is manageable with current pain management strategies. No signs of injury to other vertebral bodies.  - Continue using pain patch as needed. - Use acetaminophen  for additional pain relief if necessary. - Consider heat pad for symptomatic relief. - F/u prn.  Again discussed Fall precautions in the home.    Osteopenia, unspecified location Recent Dexascan noted, but doesn't appear to have been read yet.  Await Radiology report.    History of chest pain Declines further Cardiac followup and clearly understands the risks.        Return if symptoms worsen or fail to improve.    REDDING PONCE NORLEEN FALCON., MD     [1]  Social History Tobacco Use   Smoking status: Former    Current packs/day: 0.00    Average packs/day: 0.5 packs/day    Types: Cigarettes    Quit date: 11/09/2005    Years since quitting: 19.0    Passive exposure: Past   Smokeless tobacco: Never   Tobacco comments:    1-2 ppd  Vaping Use   Vaping status: Never Used  Substance Use Topics   Alcohol use: Never   Drug use: Never   "

## 2024-11-18 NOTE — Assessment & Plan Note (Addendum)
 Following a minor fall from a stool. No fractures or dislocations on x-ray. Pain is localized to the tailbone and is manageable with current pain management strategies. No signs of injury to other vertebral bodies.  - Continue using pain patch as needed. - Use acetaminophen  for additional pain relief if necessary. - Consider heat pad for symptomatic relief. - F/u prn.  Again discussed Fall precautions in the home.

## 2024-11-18 NOTE — Assessment & Plan Note (Addendum)
 Recent Dexascan noted, but doesn't appear to have been read yet.  Await Radiology report.

## 2024-11-26 ENCOUNTER — Telehealth (HOSPITAL_BASED_OUTPATIENT_CLINIC_OR_DEPARTMENT_OTHER): Payer: Self-pay | Admitting: Family Medicine

## 2024-11-26 NOTE — Telephone Encounter (Signed)
 Copied from CRM [id number]. Topic: Clinical - Medical Advice >> Nov 25, 2024  2:49 PM Chasity T wrote: Reason for CRM: Tamisha a publishing copy at [email or web address] [employer] is calling requesting for Dr Dottie to send over an order for patient to not be able administer her own medications due to it not being safe and they will need to be able to give them to her at the place. Please fax over the orders as well as call for anymore questions.  Phone number: [phone] Fax number: [phone]

## 2024-11-27 NOTE — Telephone Encounter (Signed)
 Spoke to Alexis Stevenson at [facility]. Pt is having a decline in cognitive health. Her mood is bizarre per Alexis Stevenson. Having some mood swings. She is currently taking them herself and it is not safe. They need a letter stating that the self administration needs to stop and needs to be signed by a provider. She would like this as soon as possible. She said she needs an updated letter later again after Dr. Dottie is back from vacation.

## 2024-11-28 ENCOUNTER — Other Ambulatory Visit (HOSPITAL_BASED_OUTPATIENT_CLINIC_OR_DEPARTMENT_OTHER): Payer: Self-pay | Admitting: Student

## 2024-11-28 NOTE — Telephone Encounter (Signed)
 Faxed letter to Tamisha from [facility] regarding cancelling self medication administration.

## 2024-12-02 ENCOUNTER — Ambulatory Visit: Admitting: Cardiology

## 2024-12-06 ENCOUNTER — Other Ambulatory Visit (HOSPITAL_BASED_OUTPATIENT_CLINIC_OR_DEPARTMENT_OTHER): Payer: Self-pay | Admitting: Family Medicine

## 2024-12-06 ENCOUNTER — Telehealth (HOSPITAL_BASED_OUTPATIENT_CLINIC_OR_DEPARTMENT_OTHER): Payer: Self-pay | Admitting: Family Medicine

## 2024-12-06 NOTE — Telephone Encounter (Signed)
 Copied from CRM #[id number]. Topic: Clinical - Medication Question >> Dec 05, 2024  5:00 PM [name] wrote: Reason for CRM: Patient's daughter calling because the patient called and told her that she is having stomach issues. The daughter believes it could be because the patient is constipated. [facility] has started to give the patient her medication and discontinue self medication. The patient normal takes Senakot every night to prevent constipation. She has not had it in a week. Daughter is requesting an order be sent to [facility] for [place].  Call back #[phone]

## 2024-12-10 ENCOUNTER — Telehealth (HOSPITAL_BASED_OUTPATIENT_CLINIC_OR_DEPARTMENT_OTHER): Payer: Self-pay | Admitting: *Deleted

## 2024-12-10 ENCOUNTER — Telehealth: Payer: Self-pay | Admitting: Family Medicine

## 2024-12-10 NOTE — Telephone Encounter (Signed)
 Pt. Daughter made aware.

## 2024-12-10 NOTE — Telephone Encounter (Signed)
 Daughter called in and stated that mother is very weak. Friday she was in the ER with stomach issues and is having trouble using the bathroom. ( Some blood in stool possibly from hemmroids). She cannot eat like she should be and is very weak and confused. She is taking rexulti prescribed by physician in [place]  and has not been taking it. She refused imaging in the ER as she was in too much pain. She would like to talk to [facility] or Dr Dottie. Do you think she is having side effects from the Rexulti and should stop taking it as he did not think she should be on it at her appointment.

## 2024-12-11 ENCOUNTER — Ambulatory Visit: Payer: Self-pay

## 2024-12-11 NOTE — Telephone Encounter (Signed)
" °  FYI Only or Action Required?: FYI only for provider: ED advised.  Patient was last seen in primary care on 11/18/2024 by Dottie Norleen PHEBE PONCE, MD.  Called Nurse Triage reporting Rectal Bleeding and Fatigue.  Symptoms began several days ago.  Interventions attempted: Other: prune juice.  Symptoms are: gradually worsening.  Triage Disposition: Go to ED Now (Notify PCP)  Patient/caregiver understands and will follow disposition?: Yes        Message from [place] S sent at 12/11/2024  9:17 AM EST  Reason for Triage: Jacquelynne Mouse, medications tech, from [facility] stated patient has diarrhea with blood, feeling weak.   Reason for Disposition  Pale skin (pallor) of new-onset or getting worse  Answer Assessment - Initial Assessment Questions Jacquelynne, med tech with [facility], calling in about patient.  Yesterday complained she was constipated, was given prune juice Then said she has not slept for a few nights due to diarrhea. Baseline: confusion.  Few drops of bright red blood in loose stool; 1 episode today that staff member is aware of. Also complaining of new paleness and worsening weakness.  No nausea, vomiting, abdominal pain, black or tarry stool.  Advised ED, staff member is agreeable and states the patient's daughter is on her way now.  Protocols used: Rectal Bleeding-A-AH  "

## 2024-12-12 ENCOUNTER — Ambulatory Visit: Payer: Self-pay

## 2024-12-12 NOTE — Telephone Encounter (Signed)
 FYI Only or Action Required?: FYI only for provider: 911 advised.  Patient was last seen in primary care on 11/18/2024 by Dottie Norleen PHEBE PONCE, MD.  Called Nurse Triage reporting Rectal Bleeding, Fatigue, and Fall.  Symptoms began today.  Interventions attempted: Rest, hydration, or home remedies.  Symptoms are: rapidly worsening.  Triage Disposition: Call EMS 911 Now  Patient/caregiver understands and will follow disposition?: Yes    Spoke with Jacquelynne med tech at [facility]. Yesterday pt with onset of weakness and diarrhea. Called triage yesterday. Advised ED. Went to [facility] ED. Dx with internal and external hemorroids. Mass in rectum per scan. Advised to f/u with PCP.  Darby reports pt significantly worse today. Very weak, having difficulty walking. Pale. Multiple episodes dark brown diarrhea today. Incontinent x1 today. Not black. Small amount of blood. About 10 episodes diarrhea in past 24 hours.  Hasn't eaten. Fell this morning in the bathroom. Did not hit head, fell on butt. 120/69 after fall. All other VS normal. Has been slurring her words today a few times. Triage cut short d/t severity of pt symptoms. Advised calling 911, Jacquelynne will call.     Message from Hadassah PARAS sent at 12/12/2024  1:05 PM EST  Reason for Triage: Pt was in the hospital yesterday and was diagnoed with Internal and external hemmorhoids, rash/yeast in groin area, mass in rectum   Pt is now worse that she was yesterday, very dark stool, cannot walk and stand on her own, had a fall this morning,   Reason for Disposition  Difficult to awaken or acting confused (e.g., disoriented, slurred speech)  Shock suspected (e.g., cold/pale/clammy skin, too weak to stand, low BP, rapid pulse)  Answer Assessment - Initial Assessment Questions 1. DIARRHEA SEVERITY: How bad is the diarrhea? How many more stools have you had in the past 24 hours than normal?      10  2. ONSET: When did the diarrhea begin?       *No Answer* 3. STOOL DESCRIPTION:  How loose or watery is the diarrhea? What is the stool color? Is there any blood or mucous in the stool?     Dark brown, soft/liquidly  4. VOMITING: Are you also vomiting? If Yes, ask: How many times in the past 24 hours?      *No Answer* 5. ABDOMEN PAIN: Are you having any abdomen pain? If Yes, ask: What does it feel like? (e.g., crampy, dull, intermittent, constant)      *No Answer* 6. ABDOMEN PAIN SEVERITY: If present, ask: How bad is the pain?  (e.g., Scale 1-10; mild, moderate, or severe)     *No Answer* 7. ORAL INTAKE: If vomiting, Have you been able to drink liquids? How much liquids have you had in the past 24 hours?     *No Answer* 8. HYDRATION: Any signs of dehydration? (e.g., dry mouth [not just dry lips], too weak to stand, dizziness, new weight loss) When did you last urinate?     *No Answer* 9. EXPOSURE: Have you traveled to a foreign country recently? Have you been exposed to anyone with diarrhea? Could you have eaten any food that was spoiled?     *No Answer* 10. ANTIBIOTIC USE: Are you taking antibiotics now or have you taken antibiotics in the past 2 months?       *No Answer* 11. OTHER SYMPTOMS: Do you have any other symptoms? (e.g., fever, blood in stool)       Weak, pale, incontinence, trouble walking, slurring  speech  12. PREGNANCY: Is there any chance you are pregnant? When was your last menstrual period?       *No Answer*  Protocols used: Diarrhea-A-AH

## 2024-12-16 ENCOUNTER — Other Ambulatory Visit (HOSPITAL_BASED_OUTPATIENT_CLINIC_OR_DEPARTMENT_OTHER): Payer: Self-pay | Admitting: Family Medicine

## 2024-12-16 ENCOUNTER — Ambulatory Visit: Payer: Self-pay

## 2024-12-16 DIAGNOSIS — M1990 Unspecified osteoarthritis, unspecified site: Secondary | ICD-10-CM

## 2024-12-16 MED ORDER — ACETAMINOPHEN ER 650 MG PO TBCR: 650.0000 mg | EXTENDED_RELEASE_TABLET | Freq: Three times a day (TID) | ORAL | 0 refills | Status: DC | PRN

## 2024-12-16 MED ORDER — ACETAMINOPHEN ER 650 MG PO TBCR: 650.0000 mg | EXTENDED_RELEASE_TABLET | Freq: Three times a day (TID) | ORAL | 3 refills | Status: AC | PRN

## 2024-12-16 NOTE — Telephone Encounter (Signed)
 Received call from [facility], ED to see to triage nurse regarding recent tailbone fracture.  No orders for pain medication.  E2C2 nurses unable to take verbal orders.  Contacted [facility] who is also unable to take verbal orders.  Sending prescription for 650 mg acetaminophen  every 8 hours to her local pharmacy.  Additional pain medication orders to be addressed by PCP.

## 2024-12-16 NOTE — Telephone Encounter (Signed)
 Shay from nursing home called back to see if pt can be prescribed lidocaine  patches. She expresses the pt is unwilling to go to the ED.      Copied from CRM #[id number]. Topic: Clinical - Red Word Triage >> Dec 16, 2024  9:52 AM Willma SAUNDERS wrote: Red Word that prompted transfer to Nurse Triage: Fall, Back Pain, Confused  Please warm transfer Red Word call to Nurse Triage and then click Send Message and Close CRM. >> Dec 16, 2024 11:12 AM Treva T wrote: Claris calling from [facility] facility on behalf of patient, requesting to speak back to nurse in regards to pt., previous reporting pt fell, and confusion.  >> Dec 16, 2024 10:01 AM Willma R wrote: Reason for Triage: Patient slide/fell out of her chair, Nurse asked where she was going and patient was confused and said she didn't know. Also has been complaining of back pain for the last week, doesn't have a prescription for pain medication.

## 2024-12-16 NOTE — Telephone Encounter (Signed)
 Routing to Dr. Dottie for review.

## 2024-12-17 ENCOUNTER — Telehealth (HOSPITAL_BASED_OUTPATIENT_CLINIC_OR_DEPARTMENT_OTHER): Payer: Self-pay | Admitting: *Deleted

## 2024-12-17 ENCOUNTER — Other Ambulatory Visit (HOSPITAL_BASED_OUTPATIENT_CLINIC_OR_DEPARTMENT_OTHER): Payer: Self-pay | Admitting: Family Medicine

## 2024-12-17 DIAGNOSIS — M1990 Unspecified osteoarthritis, unspecified site: Secondary | ICD-10-CM

## 2024-12-17 MED ORDER — LIDOCAINE 5 % EX PTCH: 1.0000 | MEDICATED_PATCH | CUTANEOUS | 0 refills | Status: DC

## 2024-12-17 MED ORDER — ACETAMINOPHEN ER 650 MG PO TBCR: 650.0000 mg | EXTENDED_RELEASE_TABLET | Freq: Three times a day (TID) | ORAL | 0 refills | Status: AC | PRN

## 2024-12-17 NOTE — Telephone Encounter (Signed)
 Spoke to [employer] and advised tylenol  650 and lidocaine  patches were sent to [facility]. They requested rx be sent in to [facility] so they can have their pharmacy fill them. She asked if Dr. Dottie put in order order for PT & OT. I also asked if she knew if pt was open to hospice. Izzy said she doubted pt wanted it. She stated she was in denial of her health condition. Pcp has been informed. He put in OT & PT orders. We are manually faxing rx to [phone] as provided by Izzy.

## 2024-12-17 NOTE — Addendum Note (Signed)
 Addended by: Billy Turvey on: 12/17/2024 01:45 PM   Modules accepted: Orders

## 2024-12-18 ENCOUNTER — Telehealth (HOSPITAL_BASED_OUTPATIENT_CLINIC_OR_DEPARTMENT_OTHER): Payer: Self-pay | Admitting: *Deleted

## 2024-12-20 ENCOUNTER — Ambulatory Visit (HOSPITAL_BASED_OUTPATIENT_CLINIC_OR_DEPARTMENT_OTHER): Admitting: Family Medicine

## 2024-12-20 ENCOUNTER — Other Ambulatory Visit (HOSPITAL_COMMUNITY): Payer: Self-pay

## 2024-12-20 ENCOUNTER — Encounter (HOSPITAL_BASED_OUTPATIENT_CLINIC_OR_DEPARTMENT_OTHER): Payer: Self-pay | Admitting: Family Medicine

## 2024-12-20 VITALS — BP 83/52 | HR 80 | Temp 98.0°F | Resp 16 | Wt 167.3 lb

## 2024-12-20 DIAGNOSIS — B379 Candidiasis, unspecified: Secondary | ICD-10-CM

## 2024-12-20 DIAGNOSIS — M1990 Unspecified osteoarthritis, unspecified site: Secondary | ICD-10-CM

## 2024-12-20 DIAGNOSIS — S322XXG Fracture of coccyx, subsequent encounter for fracture with delayed healing: Secondary | ICD-10-CM

## 2024-12-20 DIAGNOSIS — R627 Adult failure to thrive: Secondary | ICD-10-CM

## 2024-12-20 MED ORDER — NYSTATIN 100000 UNIT/GM EX POWD: 1.0000 | Freq: Three times a day (TID) | CUTANEOUS | 5 refills | Status: DC

## 2024-12-20 MED ORDER — LIDOCAINE 5 % EX PTCH: 1.0000 | MEDICATED_PATCH | CUTANEOUS | 0 refills | Status: AC

## 2024-12-20 MED ORDER — NYSTATIN 100000 UNIT/GM EX POWD: 1.0000 | Freq: Three times a day (TID) | CUTANEOUS | 5 refills | Status: AC

## 2024-12-20 MED ORDER — FLUCONAZOLE 150 MG PO TABS: 150.0000 mg | ORAL_TABLET | Freq: Once | ORAL | 0 refills | Status: DC

## 2024-12-20 MED ORDER — FLUCONAZOLE 150 MG PO TABS: 150.0000 mg | ORAL_TABLET | Freq: Once | ORAL | 0 refills | Status: AC

## 2024-12-20 NOTE — Progress Notes (Signed)
 "  Established Patient Office Visit  Subjective   Patient ID: Alexis Stevenson, female    DOB: May 14, 1934  Age: 89 y.o. MRN: [id number]  Chief Complaint  Patient presents with   Follow-up    Follow-up    Discussed the use of AI scribe software for clinical note transcription with the patient, who gave verbal consent to proceed.  History of Present Illness Alexis Stevenson is an 89 year old female who presents with rectal bleeding and a re-injured tailbone.  She has experienced multiple episodes of rectal bleeding, leading to several ER visits at [facility]. During a visit on December 16, 2024, a rectal exam was performed due to rectal bleeding, and a CT scan was done; her daughter reports that the ER doctor mentioned external and internal hemorrhoids and a mass in the rectal area. Her hemoglobin level was noted to be 13. She has not been admitted to the hospital but has had several ER visits over the past six weeks.  She has a history of constipation, which she managed with daily Senokot use. Recently, she experienced constipation for three to four days, which may have exacerbated her hemorrhoids. Her daughter mentioned that she has been taking Rexulti, which coincided with a rapid decline in her condition.  I ordered this be stopped a few days ago.  She has also been dealing with a re-injured tailbone. She initially fell and possibly fractured her tailbone, which was not confirmed until a subsequent fall on December 16, 2024, when an x-ray was performed and her daughter reports the ER staff identified an old fracture. She has been managing the pain with Tylenol , ice, and Salonpas patches, which have provided some relief.  Additionally, she is suffering from a yeast infection on her abdomen, which has caused significant rawness and discomfort. She was prescribed a cream for the infection, but her daughter believes a powder might be more effective in drying it up. She has lost  significant weight, causing her abdominal skin to fold over, exacerbating the infection.  Her daughter has been managing her medications and prescriptions, transitioning them from [facility] to the pharmacy at [facility], where she resides. There have been issues with prescription orders not being received correctly, particularly for lidocaine  patches and Tylenol .    Past Medical History:  Diagnosis Date   Anemia    Iron deficiency known to Drs. Misenheimer and Lewis   AVM (arteriovenous malformation)    small intestines   DNR (do not resuscitate)    Dyslipidemia    Essential hypertension    Former smoker    Gait disorder    Multifactorial   History of chest pain    with Cardiology followup and testing declined.   Hypothyroidism    Obesity    Osteopenia    Pernicious anemia     Outpatient Encounter Medications as of 12/20/2024  Medication Sig   acetaminophen  (TYLENOL ) 650 MG CR tablet Take 1 tablet (650 mg total) by mouth every 8 (eight) hours as needed for pain.   acetaminophen  (TYLENOL ) 650 MG CR tablet Take 1 tablet (650 mg total) by mouth every 8 (eight) hours as needed for pain.   albuterol  (VENTOLIN  HFA) 108 (90 Base) MCG/ACT inhaler Inhale 2 puffs into the lungs every 6 (six) hours as needed for wheezing or shortness of breath.   Cholecalciferol 50 MCG (2000 UT) TABS Take 2,000 Units by mouth daily.   cyanocobalamin  1000 MCG tablet Take 1,000 mcg by mouth daily.   diphenhydramine -acetaminophen  (TYLENOL   PM) 25-500 MG TABS tablet Take 1 tablet by mouth at bedtime. Nightly as needed   ferrous sulfate  325 (65 FE) MG tablet Take 325 mg by mouth daily.   levothyroxine  (SYNTHROID ) 100 MCG tablet Take 100 mcg by mouth daily before breakfast.   Multiple Vitamins-Minerals (CENTRUM SILVER 50+WOMEN) TABS Take 1 tablet by mouth daily.   olmesartan-hydrochlorothiazide (BENICAR HCT) 20-12.5 MG tablet Take 1 tablet by mouth daily.   rosuvastatin (CRESTOR) 20 MG tablet Take 20 mg by mouth  daily.   Vitamin D, Ergocalciferol, (DRISDOL) 1.25 MG (50000 UNIT) CAPS capsule Take 50,000 Units by mouth 2 (two) times a week.   [DISCONTINUED] fluconazole  (DIFLUCAN ) 150 MG tablet Take 1 tablet (150 mg total) by mouth once for 1 dose.   [DISCONTINUED] lidocaine  (LIDODERM ) 5 % Place 1 patch onto the skin daily. Remove & Discard patch within 12 hours or as directed by MD   [DISCONTINUED] nystatin  (MYCOSTATIN /NYSTOP ) powder Apply 1 Application topically 3 (three) times daily.   fluconazole  (DIFLUCAN ) 150 MG tablet Take 1 tablet (150 mg total) by mouth once for 1 dose.   lidocaine  (LIDODERM ) 5 % Place 1 patch onto the skin daily. Remove & Discard patch within 12 hours or as directed by MD   nystatin  (MYCOSTATIN /NYSTOP ) powder Apply 1 Application topically 3 (three) times daily.   pantoprazole  (PROTONIX ) 40 MG tablet Take 1 tablet (40 mg total) by mouth daily. (Patient not taking: Reported on 11/18/2024)   ranolazine  (RANEXA ) 500 MG 12 hr tablet Take 1 tablet (500 mg total) by mouth 2 (two) times daily. (Patient not taking: Reported on 11/18/2024)   senna (SENOKOT) 8.6 MG tablet Take 1 tablet by mouth as needed for constipation. (Patient taking differently: Take 1 tablet by mouth daily.)   [DISCONTINUED] fluconazole  (DIFLUCAN ) 150 MG tablet Take 1 tablet (150 mg total) by mouth once for 1 dose.   No facility-administered encounter medications on file as of 12/20/2024.    Social History   Tobacco Use   Smoking status: Former    Current packs/day: 0.00    Average packs/day: 0.5 packs/day    Types: Cigarettes    Quit date: 11/09/2005    Years since quitting: 19.1    Passive exposure: Past   Smokeless tobacco: Never   Tobacco comments:    1-2 ppd  Vaping Use   Vaping status: Never Used  Substance Use Topics   Alcohol use: Never   Drug use: Never      Review of Systems  Constitutional:  Positive for malaise/fatigue. Negative for diaphoresis, fever and weight loss.  Respiratory:  Negative  for cough, shortness of breath and wheezing.   Cardiovascular:  Negative for chest pain, palpitations, orthopnea, claudication, leg swelling and PND.      Objective:     BP (!) 83/52 (BP Location: Right Arm, Patient Position: Sitting, Cuff Size: Normal)   Pulse 80   Temp 98 F (36.7 C) (Oral)   Resp 16   Wt 167 lb 4.8 oz (75.9 kg)   SpO2 94%   BMI 27.87 kg/m    Physical Exam Constitutional:      General: She is not in acute distress.    Appearance: Normal appearance.     Comments: Frail, sitting in wheelchair.  HENT:     Head: Normocephalic.     Mouth/Throat:     Pharynx: No oropharyngeal exudate.  Cardiovascular:     Rate and Rhythm: Normal rate and regular rhythm.     Pulses: Normal pulses.  Heart sounds: Normal heart sounds.  Pulmonary:     Effort: Pulmonary effort is normal.     Breath sounds: Normal breath sounds.  Abdominal:     General: Bowel sounds are normal.     Palpations: Abdomen is soft.  Musculoskeletal:     Cervical back: Neck supple. No tenderness.     Right lower leg: No edema.     Left lower leg: No edema.  Skin:    Comments: Typical moderately severe Candida noted in abdominal body folds.  Neurological:     Mental Status: She is alert.      No results found for any visits on 12/20/24.    The ASCVD Risk score (Arnett DK, et al., 2019) failed to calculate for the following reasons:   The 2019 ASCVD risk score is only valid for ages 71 to 25   * - Cholesterol units were assumed    Assessment & Plan:   Assessment & Plan Failure to thrive in adult Significant weight loss and decline in health status. Recent ER visits for various issues, including rectal mass and hemorrhoids. Discussion about hospice care as a reasonable option given her age and declining health.  She is very agreeable to hospice care, focusing on comfort rather than aggressive treatment.  Her daughter generally seems agreeable as well.  DNR status. - Initiated hospice  referral for comfort care Orders:   Ambulatory referral to Hospice  Osteoarthritis, unspecified osteoarthritis type, unspecified site Emphasis on pain control. Orders:   lidocaine  (LIDODERM ) 5 %; Place 1 patch onto the skin daily. Remove & Discard patch within 12 hours or as directed by MD  Candida albicans infection Yeast infection on the abdomen, causing rawness and discomfort.  Keep areas clean and dry. - Prescribed antifungal powder for application on affected area - Prescribed Diflucan  pill for systemic treatment Orders:   fluconazole  (DIFLUCAN ) 150 MG tablet; Take 1 tablet (150 mg total) by mouth once for 1 dose.   nystatin  (MYCOSTATIN /NYSTOP ) powder; Apply 1 Application topically 3 (three) times daily.  Closed fracture of coccyx with delayed healing, subsequent encounter Re-injury of old coccyx fracture after recent fall. Pain management with ice, Tylenol , and Salonpas patches has been effective. - Continue pain management with ice, Tylenol , and Salonpas patches        Return if symptoms worsen or fail to improve.    REDDING PONCE NORLEEN FALCON., MD  "

## 2025-01-29 ENCOUNTER — Ambulatory Visit (HOSPITAL_BASED_OUTPATIENT_CLINIC_OR_DEPARTMENT_OTHER): Admitting: Family Medicine

## 2025-02-27 ENCOUNTER — Ambulatory Visit (HOSPITAL_BASED_OUTPATIENT_CLINIC_OR_DEPARTMENT_OTHER): Admitting: Family Medicine
